# Patient Record
Sex: Female | Born: 1960
Health system: Southern US, Community
[De-identification: ages and names within clinical notes are randomized; demographics above are authoritative.]

## PROBLEM LIST (undated history)

## (undated) DIAGNOSIS — E785 Hyperlipidemia, unspecified: Secondary | ICD-10-CM

## (undated) DIAGNOSIS — B019 Varicella without complication: Secondary | ICD-10-CM

## (undated) DIAGNOSIS — I10 Essential (primary) hypertension: Secondary | ICD-10-CM

## (undated) HISTORY — DX: Varicella without complication: B01.9

## (undated) HISTORY — DX: Hyperlipidemia, unspecified: E78.5

---
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
## 2000-04-04 ENCOUNTER — Other Ambulatory Visit: Admission: RE | Admit: 2000-04-04 | Discharge: 2000-04-04 | Payer: Self-pay | Admitting: Obstetrics and Gynecology

## 2000-04-05 ENCOUNTER — Other Ambulatory Visit: Admission: RE | Admit: 2000-04-05 | Discharge: 2000-04-05 | Payer: Self-pay | Admitting: Obstetrics and Gynecology

## 2000-04-05 ENCOUNTER — Encounter (INDEPENDENT_AMBULATORY_CARE_PROVIDER_SITE_OTHER): Payer: Self-pay

## 2000-08-19 ENCOUNTER — Ambulatory Visit (HOSPITAL_COMMUNITY): Admission: RE | Admit: 2000-08-19 | Discharge: 2000-08-19 | Payer: Self-pay | Admitting: Obstetrics and Gynecology

## 2000-08-19 ENCOUNTER — Encounter: Payer: Self-pay | Admitting: Obstetrics and Gynecology

## 2001-07-02 HISTORY — PX: UTERINE FIBROID SURGERY: SHX826

## 2001-08-14 ENCOUNTER — Encounter: Admission: RE | Admit: 2001-08-14 | Discharge: 2001-08-14 | Payer: Self-pay

## 2001-08-21 ENCOUNTER — Encounter: Admission: RE | Admit: 2001-08-21 | Discharge: 2001-08-21 | Payer: Self-pay

## 2001-09-11 ENCOUNTER — Ambulatory Visit (HOSPITAL_COMMUNITY): Admission: RE | Admit: 2001-09-11 | Discharge: 2001-09-11 | Payer: Self-pay | Admitting: Obstetrics and Gynecology

## 2001-10-21 ENCOUNTER — Inpatient Hospital Stay (HOSPITAL_COMMUNITY): Admission: RE | Admit: 2001-10-21 | Discharge: 2001-10-23 | Payer: Self-pay | Admitting: Obstetrics and Gynecology

## 2001-10-21 ENCOUNTER — Encounter (INDEPENDENT_AMBULATORY_CARE_PROVIDER_SITE_OTHER): Payer: Self-pay

## 2003-02-11 ENCOUNTER — Emergency Department (HOSPITAL_COMMUNITY): Admission: EM | Admit: 2003-02-11 | Discharge: 2003-02-11 | Payer: Self-pay | Admitting: Emergency Medicine

## 2003-02-11 ENCOUNTER — Encounter: Payer: Self-pay | Admitting: Emergency Medicine

## 2006-07-02 HISTORY — PX: FOOT SURGERY: SHX648

## 2008-05-20 ENCOUNTER — Other Ambulatory Visit: Admission: RE | Admit: 2008-05-20 | Discharge: 2008-05-20 | Payer: Self-pay | Admitting: Obstetrics and Gynecology

## 2008-05-28 ENCOUNTER — Encounter: Admission: RE | Admit: 2008-05-28 | Discharge: 2008-05-28 | Payer: Self-pay | Admitting: Obstetrics & Gynecology

## 2009-06-07 ENCOUNTER — Other Ambulatory Visit: Admission: RE | Admit: 2009-06-07 | Discharge: 2009-06-07 | Payer: Self-pay | Admitting: Family Medicine

## 2009-07-15 ENCOUNTER — Encounter: Admission: RE | Admit: 2009-07-15 | Discharge: 2009-07-15 | Payer: Self-pay | Admitting: Family Medicine

## 2009-09-05 ENCOUNTER — Emergency Department (HOSPITAL_COMMUNITY): Admission: EM | Admit: 2009-09-05 | Discharge: 2009-09-05 | Payer: Self-pay | Admitting: Emergency Medicine

## 2010-07-23 ENCOUNTER — Encounter: Payer: Self-pay | Admitting: Family Medicine

## 2010-07-23 ENCOUNTER — Encounter: Payer: Self-pay | Admitting: Obstetrics & Gynecology

## 2010-08-02 ENCOUNTER — Other Ambulatory Visit: Payer: Self-pay | Admitting: Family Medicine

## 2010-08-02 DIAGNOSIS — Z1239 Encounter for other screening for malignant neoplasm of breast: Secondary | ICD-10-CM

## 2010-08-08 ENCOUNTER — Ambulatory Visit
Admission: RE | Admit: 2010-08-08 | Discharge: 2010-08-08 | Disposition: A | Discharge status: Home / Self Care | Payer: 59 | Source: Ambulatory Visit | Attending: Family Medicine | Admitting: Family Medicine

## 2010-08-08 DIAGNOSIS — Z1239 Encounter for other screening for malignant neoplasm of breast: Secondary | ICD-10-CM

## 2010-11-17 NOTE — Op Note (Signed)
[facility]  Patient:    Rachel Love, Rachel Love Visit Number: [id number] MRN: [id number]          Service Type: GYN Location: 4W 0444 01 Attending Physician:  Oliver Pila Dictated by:   Huel Cote, M.D. Proc. Date: 10/21/01 Admit Date:  10/21/2001                             Operative Report  PREOPERATIVE DIAGNOSES: 1. Fibroid uterus. 2. Menorrhagia. 3. Anemia.  POSTOPERATIVE DIAGNOSES: 1. Fibroid uterus. 2. Menorrhagia. 3. Anemia.  PROCEDURE:  Myomectomy and attempted chromopertubation.  SURGEON:  Huel Cote, M.D.  ASSISTANT:  Malachi Pro. Ambrose Mantle, M.D.  ANESTHESIA:  General.  FLUIDS:  Estimated blood loss 350 cc.  IV fluids 2400 cc LR.  Urine output 200 cc clear urine.  FINDINGS:  The uterus was enlarged with numerous fibroids, approximately 12-14 weeks size.  The fibroids ranged in size from 1 cm to 7-8 cm.  Tubes externally were clear of adhesions, and the fimbriae were normal; however, upon chromopertubation, they did not fill even proximally.  Ovaries are normal bilaterally.  Upon performing the myomectomy, the uterine cavity was entered superiorly.  DESCRIPTION OF PROCEDURE:  The patient was taken to the operating room where general anesthesia was obtained without difficulty.  She was then prepped and draped in the normal sterile fashion in the dorsal supine position.  A speculum was then placed within the vagina and the cervix identified and an acorn tenaculum introduced into the cervix for later injection of dye for a tubal study.  The speculum was removed and the acorn left in place with a single-tooth tenaculum.  Attention was then turned to the patients abdomen which was opened in a transverse fashion with a Pfannenstiel incision.  This was carried through the underlying layer of fascia with sharp dissection and Bovie cautery.  The fascia was then nicked in the midline, and the incision was extended laterally  with Mayo scissors.  The inferior aspect was grasped with Kocher clamps, elevated, and dissected off the underlying rectus muscles. In a similar fashion, the superior aspect was elevated and dissected off the rectus muscles.  The rectus muscles were separated in the midline and the peritoneum entered bluntly.  The peritoneal incision was then extended both superiorly and inferiorly with careful attention to avoid both bowel and bladder.  The bowel was packed away with moist laparotomy sponge and a pediatric Balfour retractor placed within the incision with the bladder blade in place.  The uterus was then inspected with multiple fibroids noticed, the largest being in the posterior right aspect of the uterus and in the anterior left aspect of the uterus.  There were multiple other fibroids noted, ranging in size from 1-7 cm.  Several incisions were made on the uterus, the largest being in the midline of the uterine fundus superiorly.  This was opened and the largest fibroid removed from the position in the posterior surface. Additional incisions were also made in the anterior fundus and posterior fundus, although none of these went the entire depth to the cavity. Approximately 16 fibroids were removed by opening the uterus in approximately five incisions and shelling out the fibroid from its cavity and removing it with towel clips.  All five incisions were then closed in the following manner:  There were several deep figure-of-eight sutures of 0 Vicryl placed and an additional running layer of 0 Vicryl and finally a running  locked layer of 3-0 Vicryl in a serosal stitch.  At the conclusion of the procedure, all incisions appeared hemostatic.  Attempt was then made to inject the uterine cavity and fallopian tubes with approximately 150 cc of methylene blue. Although there was some slight leakage noted at one of the uterine incisions anteriorly, no dye was noted to fill or spill from either  fallopian tube.  The uterus was once again inspected and found to be hemostatic.  All sponges and instruments were removed from the abdomen.  A piece of Intercede was placed along the posterior surface of the uterus to prevent adhesions, and the rectus muscles and subfascial plane were inspected and found to be hemostatic; therefore, the fascia was closed with 0 Vicryl in a running fashion.  The subcutaneous tissue was reapproximated with 2-0 plain in a running fashion, and the skin was closed with staples.  Sponge, lap, and needle counts were correct x 2, and the patient was taken to the recovery room in stable condition. Dictated by:   Huel Cote, M.D. Attending Physician:  Oliver Pila DD:  10/21/01 TD:  10/21/01 Job: [id number]

## 2010-11-17 NOTE — Discharge Summary (Signed)
[facility]  Patient:    Rachel Love, Rachel Love Visit Number: [id number] MRN: [id number]          Service Type: GYN Location: 4W 0444 01 Attending Physician:  Oliver Pila Dictated by:   Alvino Chapel, M.D. Admit Date:  10/21/2001 Discharge Date: 10/23/2001                             Discharge Summary  DISCHARGE DIAGNOSES: 1. Fibroid uterus. 2. Menorrhagia. 3. Symptomatic anemia. 4. Status post myomectomy. 5. Infertility.  DISCHARGE MEDICATIONS: 1. Motrin 600 mg p.o. q.6h. 2. Percocet one or two tablets p.o. q.4h. p.r.n.  FOLLOWUP:  The patient is to follow up in the office on October 27, 2001, for her staple removal.  HOSPITAL COURSE:  The patient is a 50 year old G1, P0, who is admitted for scheduled myomectomy, given an ongoing history with menorrhagia and symptomatic anemia.  The patient underwent this surgery on October 21, 2001, without complication, and had multiple myomas removed.  PAST MEDICAL HISTORY:  None.  PAST SURGICAL HISTORY:  Foot surgery in 1977.  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 No abnormal Pap smears.  PAST OBSTETRICAL HISTORY:  In 1978, the patient had a termination of pregnancy.  On discharge, the patient was afebrile with stable vital signs.  Postoperative hemoglobin was 9.9.  Incision was clear.  Abdomen was soft.  She was doing very well, and her pain was well controlled, and was passing normal flatus.  In summary, this is a 50 year old female who is admitted for elective myomectomy and final pathology revealed 212 g of various sized myomas removed, all benign in nature.  She had an uneventful postoperative course, and was felt stable for discharge on postoperative day #2.  CONDITION ON DISCHARGE:  Improved.Dictated by:   Alvino Chapel, M.D.  Attending Physician:  Oliver Pila DD:  10/23/01 TD:  10/23/01 Job: [id number]

## 2010-11-17 NOTE — H&P (Signed)
[facility]  Patient:    Rachel Love, CISSELL Visit Number: [id number] MRN: [id number]          Service Type: OUT Location: PADM Attending Physician:  Oliver Pila Dictated by:   Alvino Chapel, M.D. Admit Date:  09/11/2001 Discharge Date: 09/11/2001                           History and Physical  HISTORY OF PRESENT ILLNESS:  The patient is a 50 year old G1, P0, who is presenting for a scheduled myomectomy given an ongoing history with menorrhagia and symptomatic anemia.  The patient has a long history of fibroids and had simply dealt with her cycles until approximately six months ago when they became much heavier, lasting approximately 7-10 days and coming at least every 21-28 days.  The patients iron count had been as low as 8.8, and she had been placed on oral iron for improvement and on birth control pills to slow her menstrual flow prior to surgery.  The patient, given her problems with anemia, desires definitive surgical therapy, however, is not ready to proceed with hysterectomy and wants a myomectomy only.  PAST MEDICAL HISTORY:  None.  PAST SURGICAL HISTORY:   Foot surgery in 1977.  GYNECOLOGICAL HISTORY:  The patient does have some tubal factor infertility and has been worked up for this in the past extensively.  However, declined any further treatment secondary to cost considerations.  She has no history of abnormal Pap smears.  FAMILY HISTORY:  No history of breast cancer.  There is some heart disease in a grandmother and a grandfather.  OBSTETRICAL HISTORY:  In 1978, the patient had a termination at five months of pregnancy, vaginal induction of labor.  PHYSICAL EXAMINATION:  VITAL SIGNS:  Height 5 feet 7 inches, weight 189 pounds.  Blood pressure 120/70.  CARDIAC:  Regular rate and rhythm.  LUNGS:  Clear.  ABDOMEN:  Soft and nontender.  PELVIC:  Cervix is normal, with no lesions noted.  Uterus is enlarged  to approximately 12-14 weeks size with irregular contour consistent with fibroids.  There are no adnexal masses.  PLAN:  The patient was counseled of the risks and benefits of the surgery including bleeding, infection, and possible damage to bowel and bladder.  She was also counseled as to the possible risk of transfusion should she bleed significantly, given her history of anemia.  The patients blood count prior to surgery was 10.1 on her preoperative laboratories, which is substantially improved for her on iron therapy.  She will also continue her Ovcon oral contraceptives until the surgery is completed.  The patient was also counseled as to the possible risk of hysterectomy only should an emergency situation ensue with uncontrolled bleeding, and the patient understands this as well. She also realizes that a myomectomy is not a 100% guarantee for improved cycle control, however, wishes to proceed with this course of action rather than a hysterectomy. Dictated by:   Alvino Chapel, M.D. Attending Physician:  Oliver Pila DD:  10/16/01 TD:  10/17/01 Job: [id number]

## 2011-10-17 ENCOUNTER — Other Ambulatory Visit: Payer: Self-pay | Admitting: Family Medicine

## 2011-10-17 DIAGNOSIS — Z1231 Encounter for screening mammogram for malignant neoplasm of breast: Secondary | ICD-10-CM

## 2011-10-24 ENCOUNTER — Ambulatory Visit: Payer: 59

## 2013-05-22 ENCOUNTER — Other Ambulatory Visit: Payer: Self-pay

## 2013-06-03 ENCOUNTER — Other Ambulatory Visit: Payer: Self-pay | Admitting: Family Medicine

## 2013-06-03 DIAGNOSIS — N644 Mastodynia: Secondary | ICD-10-CM

## 2013-06-11 ENCOUNTER — Ambulatory Visit
Admission: RE | Admit: 2013-06-11 | Discharge: 2013-06-11 | Disposition: A | Discharge status: Home / Self Care | Payer: 59 | Source: Ambulatory Visit | Attending: Family Medicine | Admitting: Family Medicine

## 2013-06-11 DIAGNOSIS — N644 Mastodynia: Secondary | ICD-10-CM

## 2013-06-24 ENCOUNTER — Emergency Department (INDEPENDENT_AMBULATORY_CARE_PROVIDER_SITE_OTHER)
Admission: EM | Admit: 2013-06-24 | Discharge: 2013-06-24 | Disposition: A | Discharge status: Home / Self Care | Payer: 59 | Source: Home / Self Care | Attending: Family Medicine | Admitting: Family Medicine

## 2013-06-24 ENCOUNTER — Emergency Department (INDEPENDENT_AMBULATORY_CARE_PROVIDER_SITE_OTHER): Payer: 59

## 2013-06-24 ENCOUNTER — Encounter (HOSPITAL_COMMUNITY): Payer: Self-pay | Admitting: Emergency Medicine

## 2013-06-24 DIAGNOSIS — R05 Cough: Secondary | ICD-10-CM

## 2013-06-24 DIAGNOSIS — R059 Cough, unspecified: Secondary | ICD-10-CM

## 2013-06-24 DIAGNOSIS — R490 Dysphonia: Secondary | ICD-10-CM

## 2013-06-24 DIAGNOSIS — R053 Chronic cough: Secondary | ICD-10-CM

## 2013-06-24 MED ORDER — OMEPRAZOLE 40 MG PO CPDR: 40.0000 mg | DELAYED_RELEASE_CAPSULE | Freq: Every day | ORAL | Status: DC

## 2013-06-24 MED ORDER — GUAIFENESIN-CODEINE 100-10 MG/5ML PO SOLN: 5.0000 mL | Freq: Every evening | ORAL | Status: DC | PRN

## 2013-06-24 MED ORDER — IPRATROPIUM BROMIDE 0.06 % NA SOLN: 2.0000 | Freq: Four times a day (QID) | NASAL | Status: DC

## 2013-06-24 MED ORDER — PREDNISONE 10 MG PO TABS: 30.0000 mg | ORAL_TABLET | Freq: Every day | ORAL | Status: DC

## 2013-06-24 NOTE — ED Notes (Signed)
52 yr old is here today with complaints of hoarseness and sinus infection and is not getting any better.

## 2013-06-24 NOTE — ED Provider Notes (Signed)
Rachel Love is a 52 y.o. female who presents to Urgent Care today for 4 weeks of moderate bothersome non-productive cough and horse voice. She has been treated with both a zpack and augmentin. She has tried albuterol which has not helped much. She notes severe episodes of post tussive emesis but denies any other nausas vomiting or diarrhea. No fever chills or trouble breathing. She is well otherwise.    No past medical history on file. History  Substance Use Topics  . Smoking status: Never Smoker   . Smokeless tobacco: Not on file  . Alcohol Use: No   ROS as above Medications reviewed. No current facility-administered medications for this encounter.   Current Outpatient Prescriptions  Medication Sig Dispense Refill  . guaiFENesin-codeine 100-10 MG/5ML syrup Take 5 mLs by mouth at bedtime as needed for cough.  120 mL  0  . ipratropium (ATROVENT) 0.06 % nasal spray Place 2 sprays into both nostrils 4 (four) times daily.  15 mL  1  . omeprazole (PRILOSEC) 40 MG capsule Take 1 capsule (40 mg total) by mouth daily.  30 capsule  0  . predniSONE (DELTASONE) 10 MG tablet Take 3 tablets (30 mg total) by mouth daily.  15 tablet  0    Exam:  BP 151/82  Pulse 83  Temp(Src) 98.4 F (36.9 C) (Oral)  Resp 18  SpO2 99%  LMP 02/22/2013 Gen: Well NAD HEENT: EOMI,  MMM, TMs normal BL. Post pharynx is erythematous mildly.  Lungs: Normal work of breathing. CTABL Heart: RRR no MRG Abd: NABS, Soft. NT, ND Exts: Non edematous BL  LE, warm and well perfused.   No results found for this or any previous visit (from the past 24 hour(s)). Dg Chest 2 View  06/24/2013   CLINICAL DATA:  Cough and chest pain .  EXAM: CHEST  2 VIEW  COMPARISON:  None.  FINDINGS: Mediastinum and hilar structures are normal. Lungs are clear. Heart size normal.  IMPRESSION: No active cardiopulmonary disease.   Electronically Signed   By: Maisie Fus  Register   On: 06/24/2013 14:49    Assessment and Plan: 52 y.o. female with  chronic cough and hoarse voice. This is likely post viral versus postnasal drip. Plan to treat with several modalities. We'll use omeprazole to combat the possibility of reflux. Additionally using over-the-counter cetirizine for allergies and we'll continue albuterol for asthma. Additionally use low dose short duration prednisone and Atrovent nasal spray. Will additionally use codeine containing cough medication for symptom control. However if not improving would recommend followup with an ENT doctor.   Discussed warning signs or symptoms. Please see discharge instructions. Patient expresses understanding.      Rodolph Bong, MD 06/24/13 [phone]

## 2013-08-06 ENCOUNTER — Other Ambulatory Visit (HOSPITAL_COMMUNITY)
Admission: RE | Admit: 2013-08-06 | Discharge: 2013-08-06 | Disposition: A | Discharge status: Home / Self Care | Payer: 59 | Source: Ambulatory Visit | Attending: Family Medicine | Admitting: Family Medicine

## 2013-08-06 DIAGNOSIS — Z1151 Encounter for screening for human papillomavirus (HPV): Secondary | ICD-10-CM | POA: Insufficient documentation

## 2013-08-06 DIAGNOSIS — Z0000 Encounter for general adult medical examination without abnormal findings: Secondary | ICD-10-CM | POA: Insufficient documentation

## 2013-08-07 ENCOUNTER — Other Ambulatory Visit: Payer: Self-pay | Admitting: Family Medicine

## 2014-07-28 ENCOUNTER — Other Ambulatory Visit: Payer: Self-pay | Admitting: Family Medicine

## 2014-07-28 DIAGNOSIS — Z1231 Encounter for screening mammogram for malignant neoplasm of breast: Secondary | ICD-10-CM

## 2014-08-05 ENCOUNTER — Encounter (INDEPENDENT_AMBULATORY_CARE_PROVIDER_SITE_OTHER): Payer: Self-pay

## 2014-08-05 ENCOUNTER — Ambulatory Visit
Admission: RE | Admit: 2014-08-05 | Discharge: 2014-08-05 | Disposition: A | Discharge status: Home / Self Care | Payer: 59 | Source: Ambulatory Visit | Attending: Family Medicine | Admitting: Family Medicine

## 2014-08-05 DIAGNOSIS — Z1231 Encounter for screening mammogram for malignant neoplasm of breast: Secondary | ICD-10-CM

## 2016-04-10 ENCOUNTER — Other Ambulatory Visit: Payer: Self-pay | Admitting: Family Medicine

## 2016-04-10 DIAGNOSIS — Z1231 Encounter for screening mammogram for malignant neoplasm of breast: Secondary | ICD-10-CM

## 2016-04-20 ENCOUNTER — Ambulatory Visit
Admission: RE | Admit: 2016-04-20 | Discharge: 2016-04-20 | Disposition: A | Discharge status: Home / Self Care | Payer: 59 | Source: Ambulatory Visit | Attending: Family Medicine | Admitting: Family Medicine

## 2016-04-20 DIAGNOSIS — Z1231 Encounter for screening mammogram for malignant neoplasm of breast: Secondary | ICD-10-CM

## 2016-09-28 DIAGNOSIS — I10 Essential (primary) hypertension: Secondary | ICD-10-CM | POA: Diagnosis not present

## 2016-09-28 DIAGNOSIS — R51 Headache: Secondary | ICD-10-CM | POA: Diagnosis not present

## 2016-09-28 DIAGNOSIS — I160 Hypertensive urgency: Secondary | ICD-10-CM | POA: Diagnosis not present

## 2016-09-28 DIAGNOSIS — R42 Dizziness and giddiness: Secondary | ICD-10-CM | POA: Diagnosis not present

## 2016-09-28 DIAGNOSIS — Z87891 Personal history of nicotine dependence: Secondary | ICD-10-CM | POA: Diagnosis not present

## 2016-09-29 ENCOUNTER — Encounter (HOSPITAL_COMMUNITY): Payer: Self-pay | Admitting: Emergency Medicine

## 2016-09-29 ENCOUNTER — Emergency Department (HOSPITAL_COMMUNITY)
Admission: EM | Admit: 2016-09-29 | Discharge: 2016-09-29 | Disposition: A | Discharge status: Home / Self Care | Payer: 59 | Attending: Emergency Medicine | Admitting: Emergency Medicine

## 2016-09-29 DIAGNOSIS — I10 Essential (primary) hypertension: Secondary | ICD-10-CM | POA: Insufficient documentation

## 2016-09-29 DIAGNOSIS — Z79899 Other long term (current) drug therapy: Secondary | ICD-10-CM | POA: Insufficient documentation

## 2016-09-29 HISTORY — DX: Essential (primary) hypertension: I10

## 2016-09-29 MED ORDER — AMLODIPINE BESYLATE 5 MG PO TABS: 5.0000 mg | ORAL_TABLET | Freq: Every day | ORAL | 1 refills | Status: DC

## 2016-09-29 NOTE — ED Triage Notes (Signed)
Pt reports having headache that began this morning and has been having issues with elevated blood pressure. Pt states that she was at [facility] last night for HTN and given prescription for Norvac and took dose this morning. Pt reports bp at 205/112. Pt reports headache goes across front of head and aching in nature.

## 2016-09-29 NOTE — ED Provider Notes (Signed)
[facility]-EMERGENCY DEPT Provider Note   CSN: [id number] Arrival date & time: 09/29/16  1931     History   Chief Complaint Chief Complaint  Patient presents with  . Hypertension  . Headache    HPI Rachel Love is a 56 y.o. female.  HPI Patient was seen at [facility] yesterday with dizziness and hypertension. She had labs, CT and EKG done. She was started on Norvasc 5 mg. The patient reports that today she had a pressure headache on her forehead and was still concerned about her blood pressure. Today the blood pressures have been running 160s to 180s systolic. No blurred vision. No weakness numbness or incoordination. No chest pain or shortness of breath. No nausea. Yesterday patient went to the ED because she had developed dizziness while at work and checked her blood pressure which was at systolic 215 mmhg. The patient had been put on lisinopril 2 years ago for hypertension. After taking it for about a year she determined that it was the cause of persistent cough and discontinued it. She reports that she never followed up to start a different medication and did not monitor her blood pressure the interim time. Past Medical History:  Diagnosis Date  . Hypertension     There are no active problems to display for this patient.   Past Surgical History:  Procedure Laterality Date  . UTERINE FIBROID SURGERY  2003    OB History    No data available       Home Medications    Prior to Admission medications   Medication Sig Start Date End Date Taking? Authorizing Provider  amLODipine (NORVASC) 5 MG tablet Take 1 tablet (5 mg total) by mouth daily. 09/29/16   Arby Barrette, MD  guaiFENesin-codeine 100-10 MG/5ML syrup Take 5 mLs by mouth at bedtime as needed for cough. 06/24/13   Rodolph Bong, MD  ipratropium (ATROVENT) 0.06 % nasal spray Place 2 sprays into both nostrils 4 (four) times daily. 06/24/13   Rodolph Bong, MD  omeprazole (PRILOSEC) 40 MG capsule Take 1 capsule (40  mg total) by mouth daily. 06/24/13   Rodolph Bong, MD  predniSONE (DELTASONE) 10 MG tablet Take 3 tablets (30 mg total) by mouth daily. 06/24/13   Rodolph Bong, MD    Family History History reviewed. No pertinent family history.  Social History Social History  Substance Use Topics  . Smoking status: Never Smoker  . Smokeless tobacco: Never Used  . Alcohol use No     Allergies   Patient has no known allergies.   Review of Systems Review of Systems 10 Systems reviewed and are negative for acute change except as noted in the HPI.   Physical Exam Updated Vital Signs BP (!) 168/94   Pulse 86   Temp 98.3 F (36.8 C) (Oral)   Resp 18   Ht  (1.676 m)   Wt 226 lb (102.5 kg)   LMP 02/22/2013   SpO2 99%   BMI 36.48 kg/m   Physical Exam  Constitutional: She is oriented to person, place, and time. She appears well-developed and well-nourished. No distress.  HENT:  Head: Normocephalic and atraumatic.  Mouth/Throat: Oropharynx is clear and moist.  Eyes: Conjunctivae and EOM are normal. Pupils are equal, round, and reactive to light.  Neck: Neck supple.  Cardiovascular: Normal rate, regular rhythm and normal heart sounds.   No murmur heard. Pulmonary/Chest: Effort normal and breath sounds normal. No respiratory distress.  Abdominal: Soft. There  is no tenderness.  Musculoskeletal: She exhibits no edema or tenderness.  Neurological: She is alert and oriented to person, place, and time. No cranial nerve deficit. She exhibits normal muscle tone. Coordination normal.  Skin: Skin is warm and dry.  Psychiatric: She has a normal mood and affect.  Nursing note and vitals reviewed.    ED Treatments / Results  Labs (all labs ordered are listed, but only abnormal results are displayed) Labs Reviewed - No data to display  EKG  EKG Interpretation None       Radiology No results found.  Procedures Procedures (including critical care time)  Medications Ordered in  ED Medications - No data to display   Initial Impression / Assessment and Plan / ED Course  I have reviewed the triage vital signs and the nursing notes.  Pertinent labs & imaging results that were available during my care of the patient were reviewed by me and considered in my medical decision making (see chart for details).      Final Clinical Impressions(s) / ED Diagnoses   Final diagnoses:  Essential hypertension   Patient is alert and well. She does not have any neurologic dysfunction. She has had a pressure-like headache that is not severe today. No signs of endorgan damage. Patient had CT done yesterday when she was seen at [facility]. Blood pressure is actually improved after starting Norvasc. Pressures today ranged between systolic 180-160. At this time, I will increase the patient's Norvasc dose to 10 mg daily. She is counseled on dietary measures and expeditious follow-up for monitoring and recheck. New Prescriptions New Prescriptions   AMLODIPINE (NORVASC) 5 MG TABLET    Take 1 tablet (5 mg total) by mouth daily.     Arby Barrette, MD 09/29/16 2238

## 2016-10-23 ENCOUNTER — Encounter: Payer: Self-pay | Admitting: Family Medicine

## 2016-10-23 ENCOUNTER — Ambulatory Visit (INDEPENDENT_AMBULATORY_CARE_PROVIDER_SITE_OTHER): Payer: 59 | Admitting: Family Medicine

## 2016-10-23 VITALS — BP 145/90 | HR 97 | Resp 12 | Ht 66.0 in | Wt 230.4 lb

## 2016-10-23 DIAGNOSIS — M159 Polyosteoarthritis, unspecified: Secondary | ICD-10-CM | POA: Diagnosis not present

## 2016-10-23 DIAGNOSIS — Z1211 Encounter for screening for malignant neoplasm of colon: Secondary | ICD-10-CM | POA: Diagnosis not present

## 2016-10-23 DIAGNOSIS — E559 Vitamin D deficiency, unspecified: Secondary | ICD-10-CM | POA: Diagnosis not present

## 2016-10-23 DIAGNOSIS — E785 Hyperlipidemia, unspecified: Secondary | ICD-10-CM | POA: Diagnosis not present

## 2016-10-23 DIAGNOSIS — Z6837 Body mass index (BMI) 37.0-37.9, adult: Secondary | ICD-10-CM | POA: Diagnosis not present

## 2016-10-23 DIAGNOSIS — Z6834 Body mass index (BMI) 34.0-34.9, adult: Secondary | ICD-10-CM

## 2016-10-23 DIAGNOSIS — E669 Obesity, unspecified: Secondary | ICD-10-CM

## 2016-10-23 DIAGNOSIS — E663 Overweight: Secondary | ICD-10-CM | POA: Insufficient documentation

## 2016-10-23 DIAGNOSIS — I10 Essential (primary) hypertension: Secondary | ICD-10-CM | POA: Diagnosis not present

## 2016-10-23 LAB — LIPID PANEL
CHOL/HDL RATIO: 5
Cholesterol: 249 mg/dL — ABNORMAL HIGH (ref 0–200)
HDL: 46 mg/dL (ref >39.00)
LDL Cholesterol: 187 mg/dL — ABNORMAL HIGH (ref 0–99)
NonHDL: 203.15
TRIGLYCERIDES: 81 mg/dL (ref 0.0–149.0)
VLDL: 16.2 mg/dL (ref 0.0–40.0)

## 2016-10-23 LAB — VITAMIN D 25 HYDROXY (VIT D DEFICIENCY, FRACTURES): VITD: 35.68 ng/mL (ref 30.00–100.00)

## 2016-10-23 MED ORDER — AMLODIPINE BESYLATE 10 MG PO TABS: 10.0000 mg | ORAL_TABLET | Freq: Every day | ORAL | 3 refills | Status: DC

## 2016-10-23 NOTE — Progress Notes (Signed)
Pre visit review using our clinic review tool, if applicable. No additional management support is needed unless otherwise documented below in the visit note. 

## 2016-10-23 NOTE — Patient Instructions (Signed)
A few things to remember from today's visit:   Colon cancer screening - Plan: Ambulatory referral to Gastroenterology  Hypertension, essential, benign - Plan: amLODipine (NORVASC) 10 MG tablet  Vitamin D deficiency - Plan: VITAMIN D 25 Hydroxy (Vit-D Deficiency, Fractures)  Hyperlipidemia, unspecified hyperlipidemia type - Plan: Lipid panel  Blood pressure goal for most people is less than 140/90. Some populations (older than 60) the goal is less than 150/90.  Most recent cardiologists' recommendations recommend blood pressure at or less than 130/80.   Elevated blood pressure increases the risk of strokes, heart and kidney disease, and eye problems. Regular physical activity and a healthy diet (DASH diet) usually help. Low salt diet. Take medications as instructed.  Caution with some over the counter medications as cold medications, dietary products (for weight loss), and Ibuprofen or Aleve (frequent use);all these medications could cause elevation of blood pressure.  DASH diet recommended: high in vegetables, fruits, low-fat dairy products, whole grains, poultry, fish, and nuts; and low in sweets, sugar-sweetened beverages, and red meats.  Tumeric for joint pain.    Please be sure medication list is accurate. If a new problem present, please set up appointment sooner than planned today.

## 2016-10-23 NOTE — Progress Notes (Signed)
HPI:   [name] is a 56 y.o. female, who is here today to establish care.  Former PCP: Dr Tamala Julian Last preventive routine visit: 06/2015  Chronic medical problems: HTN, vit D deficiency,and HLD among some.    Concerns today:   HTN Since 2014. Recent ER visit, 09/28/16 and 09/29/16 because elevated BP and headache. She was started on Amlodipine 5 mg daily. 09/28/16 Head CT was done: Negative. Also CBC,CMP,urine tox screening, and TSH were done,otherwise negative except for GLU 104 and Hg 11.8.   Home BP's: 120/70 first thing in the morning right after getting up, latter during the day:130/70's Dx with HTN 06/2015. She took Lisinopril in the past that caused cough, so discontinued.  Denies severe headache (resolved), visual changes, chest pain, dyspnea, palpitation, claudication, focal weakness, or edema.  She exercises regularly, since her last ER visit she has changed her diet: decreased salt intake and increased vegetables. Last eye exam 06/2016.  Joint pain: For years she has had generalized arthralgias, hips mainly, anterior and lateral aspect bilateral.  Also IP both hands and knees. Pain exacerbated by certain activities, prolonged walking and standing (knees and hips). No limitation of IP joints. + Stiffness,intermittently, alleviated by movement.  No edema or erythema. Joint pain getting worse. She attributes problem to wt gain, hoping pain will resolve if she loses wt.  She would like Vit D checked,reports remote Hx of low Vit D. 2015 she completed Ergocalciferol 50,000 U treatment. She is not on Vit D supplementation.  HLD: Last FLP 08/2013: TC 235,LDL 181,HDL 44,and TG 52 She is on non pharmacologic treatment.   Review of Systems  Constitutional: Negative for activity change, appetite change, fatigue and fever.  HENT: Negative for mouth sores, nosebleeds, sore throat and trouble swallowing.   Eyes: Negative for redness and visual  disturbance.  Respiratory: Negative for cough, shortness of breath and wheezing.   Cardiovascular: Negative for chest pain, palpitations and leg swelling.  Gastrointestinal: Negative for abdominal pain, nausea and vomiting.       Negative for changes in bowel habits.  Endocrine: Negative for cold intolerance and heat intolerance.  Genitourinary: Negative for decreased urine volume and hematuria.  Musculoskeletal: Positive for arthralgias. Negative for gait problem, joint swelling and myalgias.  Skin: Negative for pallor and rash.  Neurological: Negative for syncope, weakness and headaches.  Psychiatric/Behavioral: Negative for confusion. The patient is not nervous/anxious.     No current outpatient prescriptions on file prior to visit.   No current facility-administered medications on file prior to visit.      Past Medical History:  Diagnosis Date  . Chicken pox   . Hyperlipidemia   . Hypertension    No Known Allergies  Family History  Problem Relation Age of Onset  . Hypertension Mother   . Cancer Father     lung  . Heart disease Maternal Uncle     CAD    Social History   Social History  . Marital status: Married    Spouse name: N/A  . Number of children: N/A  . Years of education: N/A   Social History Main Topics  . Smoking status: Never Smoker  . Smokeless tobacco: Never Used  . Alcohol use No  . Drug use: No  . Sexual activity: Yes   Other Topics Concern  . None   Social History Narrative  . None    Vitals:   10/23/16 1406 10/23/16 1447  BP: (!) 150/98 (!) 145/90  Pulse: 97   Resp: 12   O2 sat at RA 97% Body mass index is 37.18 kg/m.   Physical Exam  Nursing note and vitals reviewed. Constitutional: She is oriented to person, place, and time. She appears well-developed. No distress.  HENT:  Head: Atraumatic.  Mouth/Throat: Oropharynx is clear and moist and mucous membranes are normal.  Eyes: Conjunctivae and EOM are normal. Pupils are  equal, round, and reactive to light.  Neck: No tracheal deviation present. No thyroid mass and no thyromegaly present.  Cardiovascular: Normal rate and regular rhythm.   No murmur heard. Pulses:      Dorsalis pedis pulses are 2+ on the right side, and 2+ on the left side.  Respiratory: Effort normal and breath sounds normal. No respiratory distress.  GI: Soft. She exhibits no mass. There is no hepatomegaly. There is no tenderness.  Musculoskeletal: She exhibits no edema.  Decreased ROM hips, mild pain elicited. No signs of synovitis. Antalgic gait.  Lymphadenopathy:    She has no cervical adenopathy.  Neurological: She is alert and oriented to person, place, and time. She has normal strength. Coordination normal.  Skin: Skin is warm. No erythema.  Psychiatric: She has a normal mood and affect.  Well groomed, good eye contact.    ASSESSMENT AND PLAN:   Rachel Love was seen today for establish care.  Diagnoses and all orders for this visit:  Lab Results  Component Value Date   CHOL 249 (H) 10/23/2016   HDL 46.00 10/23/2016   LDLCALC 187 (H) 10/23/2016   TRIG 81.0 10/23/2016   CHOLHDL 5 10/23/2016    Generalized osteoarthritis of multiple sites  We discussed Dx, treatment options, and prognosis. OTC Tumeric may help as well as topical Icy hot,Tai [place]. Tylenol 650 mg tid recommended, can take Tylenol PM at night.  Hypertension, essential, benign  Still not well controlled. Possible complications of elevated BP discussed. Amlodipine increased from 5 mg to 10 mg. Annual eye examination. BP check in 6 weeks and f/u in 3-4 months.  -     amLODipine (NORVASC) 10 MG tablet; Take 1 tablet (10 mg total) by mouth daily.  Vitamin D deficiency  Vit D3 [phone] U daily. Further recommendations will be given according to lab results.  -     VITAMIN D 25 Hydroxy (Vit-D Deficiency, Fractures)  Hyperlipidemia, unspecified hyperlipidemia type  Continue low fat diet, will follow labs  done today and will give further recommendations accordingly. F/U in 6-12 months.  -     Lipid panel  Class 2 obesity without serious comorbidity with body mass index (BMI) of 37.0 to 37.9 in adult, unspecified obesity type  We discussed benefits of wt loss as well as adverse effects of obesity. Consistency with healthy diet and physical activity recommended.   Colon cancer screening -     Ambulatory referral to Gastroenterology       Raychelle Hudman G. [place], MD  [facility]. [place] office.

## 2016-10-29 ENCOUNTER — Other Ambulatory Visit: Payer: Self-pay

## 2016-10-29 MED ORDER — PRAVASTATIN SODIUM 20 MG PO TABS: 20.0000 mg | ORAL_TABLET | Freq: Every day | ORAL | 3 refills | Status: DC

## 2016-12-24 NOTE — Progress Notes (Signed)
HPI:   Rachel Love is a 56 y.o. female, who is here today to follow on recent OV.   She was last seen on 10/23/16.   Hypertension:   Home BP readings: 120's/70-80's.  Currently on Amlodipine 10 mg daily.    She is taking medications as instructed, no side effects reported.  She has not noted headache, visual changes, exertional chest pain, dyspnea,  focal weakness, or worsening edema.  Labs done on 09/28/16:   Na 144   Potassium 3.8   Cl 104   CO2 29   Glucose 104 (H)   BUN 15   Creatinine 0.63   Ca 9.4   ALK PHOS 121   T Bili 0.28   Total Protein 7.6   Alb 3.9   GLOBULIN 3.7   ALBUMIN/GLOBULIN RATIO 1.1   BUN/CREAT RATIO 23.8   ALT 14   AST 17   GFR AFRICAN AMERICAN 117    She brought her BP monitor today.  Obesity:  She has changed her diet, has noted some wt loss. Yesterday she had:  Breakfast: Apple, coffee  with cream. Lunch: Salad and apple. Dinner: [place] and squash   She has not started exercising but planning on starting going to the gym.   Hyperlipidemia:  Currently on non pharmacologic treat,et, she discontinued Pravastatin 20 mg because it caused muscle aching.Symptom resolved a few days after she discontinued statin.    Lab Results  Component Value Date   CHOL 249 (H) 10/23/2016   HDL 46.00 10/23/2016   LDLCALC 187 (H) 10/23/2016   TRIG 81.0 10/23/2016   CHOLHDL 5 10/23/2016     Review of Systems  Constitutional: Negative for appetite change, fatigue and fever.  HENT: Negative for mouth sores, nosebleeds and trouble swallowing.   Eyes: Negative for redness and visual disturbance.  Respiratory: Negative for shortness of breath and wheezing.   Cardiovascular: Negative for chest pain, palpitations and leg swelling.  Gastrointestinal: Negative for abdominal pain, nausea and vomiting.       Negative for changes in bowel habits.  Genitourinary: Negative for decreased urine volume and hematuria.  Skin:  Negative for rash.  Neurological: Negative for syncope, weakness and headaches.  Psychiatric/Behavioral: Negative for confusion. The patient is not nervous/anxious.     No current outpatient prescriptions on file prior to visit.   No current facility-administered medications on file prior to visit.      Past Medical History:  Diagnosis Date  . Chicken pox   . Hyperlipidemia   . Hypertension    No Known Allergies  Social History   Social History  . Marital status: Married    Spouse name: N/A  . Number of children: N/A  . Years of education: N/A   Social History Main Topics  . Smoking status: Never Smoker  . Smokeless tobacco: Never Used  . Alcohol use No  . Drug use: No  . Sexual activity: Yes   Other Topics Concern  . None   Social History Narrative  . None    Vitals:   12/25/16 1420 12/25/16 1447  BP: (!) 142/90 140/82  Pulse: 85   Resp: 12    Body mass index is 36.24 kg/m.  Wt Readings from Last 3 Encounters:  12/25/16 224 lb 8 oz (101.8 kg)  10/23/16 230 lb 6 oz (104.5 kg)  09/29/16 226 lb (102.5 kg)    Physical Exam  Nursing note and vitals reviewed. Constitutional: She is oriented to  person, place, and time. She appears well-developed. No distress.  HENT:  Head: Atraumatic.  Mouth/Throat: Oropharynx is clear and moist and mucous membranes are normal.  Eyes: Conjunctivae and EOM are normal. Pupils are equal, round, and reactive to light.  Cardiovascular: Normal rate and regular rhythm.   No murmur heard. Pulses:      Dorsalis pedis pulses are 2+ on the right side, and 2+ on the left side.  Respiratory: Effort normal and breath sounds normal. No respiratory distress.  Musculoskeletal: She exhibits edema (1+ bilateral pitting LE edema.). She exhibits no tenderness.  Lymphadenopathy:    She has no cervical adenopathy.  Neurological: She is alert and oriented to person, place, and time. She has normal strength. Gait normal.  Antalgic gait.    Skin: Skin is warm. No erythema.  Psychiatric: She has a normal mood and affect.  Well groomed, good eye contact.     ASSESSMENT AND PLAN:    Ms Lachlan was seen today for follow-up.  Diagnoses and all orders for this visit:  Class 2 obesity without serious comorbidity with body mass index (BMI) of 37.0 to 37.9 in adult, unspecified obesity type  She lost about 6 Lb since 09/2016. We discussed benefits of wt loss as well as adverse effects of obesity. Consistency with healthy diet and engaging in physical activity recommended. We discussed some of her choices and better options she can try. For breakfast recommend Eggs 1-2 and a piece of toast for ex. < 100 cal snacks, and checking labels.  Daily brisk walking for 15-30 min as tolerated.  Hypertension, essential, benign  Her BP monitor seems to be accurate. Adequately controlled based on home BP readings. Some side effects of Amlodipine discussed.  No changes in current management. DASH-low salt diet to continue. Eye exam recommended annually. F/U in 6 months, before if needed.  -     amLODipine (NORVASC) 10 MG tablet; Take 1 tablet (10 mg total) by mouth daily.  Hyperlipidemia, unspecified hyperlipidemia type  She did not tolerate statin. Continue non pharmacologic treatment. Will re-check FLP next OB, 5 months.       Lewellyn Fultz G. [place], MD  [facility]. [place] office.

## 2016-12-25 ENCOUNTER — Encounter: Payer: Self-pay | Admitting: Family Medicine

## 2016-12-25 ENCOUNTER — Ambulatory Visit (INDEPENDENT_AMBULATORY_CARE_PROVIDER_SITE_OTHER): Payer: 59 | Admitting: Family Medicine

## 2016-12-25 VITALS — BP 140/82 | HR 85 | Resp 12 | Ht 66.0 in | Wt 224.5 lb

## 2016-12-25 DIAGNOSIS — Z6837 Body mass index (BMI) 37.0-37.9, adult: Secondary | ICD-10-CM

## 2016-12-25 DIAGNOSIS — E669 Obesity, unspecified: Secondary | ICD-10-CM | POA: Diagnosis not present

## 2016-12-25 DIAGNOSIS — E785 Hyperlipidemia, unspecified: Secondary | ICD-10-CM

## 2016-12-25 DIAGNOSIS — I10 Essential (primary) hypertension: Secondary | ICD-10-CM

## 2016-12-25 MED ORDER — AMLODIPINE BESYLATE 10 MG PO TABS: 10.0000 mg | ORAL_TABLET | Freq: Every day | ORAL | 2 refills | Status: DC

## 2016-12-25 NOTE — Patient Instructions (Signed)
A few things to remember from today's visit:   Class 2 obesity without serious comorbidity with body mass index (BMI) of 37.0 to 37.9 in adult, unspecified obesity type  Hypertension, essential, benign - Plan: amLODipine (NORVASC) 10 MG tablet  Hyperlipidemia, unspecified hyperlipidemia type   Please be sure medication list is accurate. If a new problem present, please set up appointment sooner than planned today.

## 2017-03-22 ENCOUNTER — Ambulatory Visit (INDEPENDENT_AMBULATORY_CARE_PROVIDER_SITE_OTHER): Payer: 59 | Admitting: Family Medicine

## 2017-03-22 ENCOUNTER — Telehealth: Payer: Self-pay

## 2017-03-22 ENCOUNTER — Ambulatory Visit (INDEPENDENT_AMBULATORY_CARE_PROVIDER_SITE_OTHER)
Admission: RE | Admit: 2017-03-22 | Discharge: 2017-03-22 | Disposition: A | Discharge status: Home / Self Care | Payer: 59 | Source: Ambulatory Visit | Attending: Family Medicine | Admitting: Family Medicine

## 2017-03-22 ENCOUNTER — Encounter: Payer: Self-pay | Admitting: Family Medicine

## 2017-03-22 VITALS — BP 158/80 | HR 113 | Temp 98.4°F | Wt 224.8 lb

## 2017-03-22 DIAGNOSIS — I10 Essential (primary) hypertension: Secondary | ICD-10-CM | POA: Diagnosis not present

## 2017-03-22 DIAGNOSIS — R200 Anesthesia of skin: Secondary | ICD-10-CM

## 2017-03-22 LAB — BASIC METABOLIC PANEL
BUN: 14 mg/dL (ref 6–23)
CO2: 26 mEq/L (ref 19–32)
Calcium: 9.6 mg/dL (ref 8.4–10.5)
Chloride: 105 mEq/L (ref 96–112)
Creatinine, Ser: 0.69 mg/dL (ref 0.40–1.20)
GFR: 113.24 mL/min (ref >60.00)
GLUCOSE: 151 mg/dL — AB (ref 70–99)
POTASSIUM: 3.7 meq/L (ref 3.5–5.1)
Sodium: 138 mEq/L (ref 135–145)

## 2017-03-22 LAB — FOLATE: Folate: 10.6 ng/mL (ref >5.9)

## 2017-03-22 LAB — VITAMIN B12: Vitamin B-12: 780 pg/mL (ref 211–911)

## 2017-03-22 NOTE — Progress Notes (Signed)
Subjective:    Patient ID: Rachel Love, female    DOB: 06/23/1961, 56 y.o.   MRN: [id number]  Chief Complaint  Patient presents with  . Numbness    HPI Patient was seen today for acute concern.  Patient with right-sided numbness since Monday. On Sunday patient took Norvasc but forgot and took another pill. When patient lies sheet the second pill she induced vomiting. Patient states after that she felt funny. The next day patient noticed the right-sided numbness. Later that Monday afternoon patient felt anxious secondary to increased stress. Patient caught the feeling would go away but it has not. Today at work patient felt some lightheadedness. BP was checked and normal. Patient decided to come into clinic to be checked. Numbness is in the right side of patient's jaw, neck, right arm, side of right leg.  Patient denies right-sided weakness, tingling, headache, changes in vision, nausea, vomiting, facial droop, difficulty with speech, recent travel. Patient does mention possible mosquito bite approximately one week ago.  Of note patient with history of low back pain. Currently ambulating with a limp secondary to back pain.  Past Medical History:  Diagnosis Date  . Chicken pox   . Hyperlipidemia   . Hypertension     Past Surgical History:  Procedure Laterality Date  . UTERINE FIBROID SURGERY  2003    Family History  Problem Relation Age of Onset  . Hypertension Mother   . Cancer Father        lung  . Heart disease Maternal Uncle        CAD    Social History   Social History  . Marital status: Married    Spouse name: N/A  . Number of children: N/A  . Years of education: N/A   Occupational History  . Not on file.   Social History Main Topics  . Smoking status: Never Smoker  . Smokeless tobacco: Never Used  . Alcohol use No  . Drug use: No  . Sexual activity: Yes   Other Topics Concern  . Not on file   Social History Narrative  . No narrative on file     Outpatient Medications Prior to Visit  Medication Sig Dispense Refill  . amLODipine (NORVASC) 10 MG tablet Take 1 tablet (10 mg total) by mouth daily. 90 tablet 2   No facility-administered medications prior to visit.     No Known Allergies  ROS General: Denies fever, chills, night sweats, changes in weight, changes in appetite HEENT: Denies headaches, ear pain, changes in vision, rhinorrhea, sore throat CV: Denies CP, palpitations, SOB, orthopnea Pulm: Denies SOB, cough, wheezing GI: Denies abdominal pain, nausea, vomiting, diarrhea, constipation GU: Denies dysuria, hematuria, frequency, vaginal discharge Msk: Denies muscle cramps, joint pains Neuro: Denies weakness, numbness, tingling  + right-sided numbness Skin: Denies rashes, bruising Psych: Denies depression, anxiety, hallucinations     Objective:    Blood pressure (!) 158/80, pulse (!) 113, temperature 98.4 F (36.9 C), temperature source Oral, weight 224 lb 12.8 oz (102 kg), last menstrual period 02/22/2013.   Gen. Pleasant, well-nourished, in no distress, normal affect   HEENT: [place]/AT, face symmetric, no scleral icterus, PERRLA, EOMI, nares patent without drainage, pharynx without erythema or exudate. Neck: No JVD, no thyromegaly, no carotid bruits Lungs: no accessory muscle use, CTAB, no wheezes or rales Cardiovascular: RRR, no m/r/g, no peripheral edema Abdomen: soft and non-tender, no hepatosplenomegaly, BS normal. Musculoskeletal: No deformities, no cyanosis or clubbing, normal tone.   Neuro:  A&Ox3,  CN II-XII intact, normal gait, Strength in UEs and LEs b/l 5/5.  Sensation in face and extremities equal b/l. Skin:  Warm, no lesions/ rash  Assessment/Plan:  Right Sided Numbness  -Discussed possible causes with pt: Vitamin deficiency, electrolyte abnormality, viral infection, TIA, CVA. -The less likely given normal neuro exam will obtain CT head stat to rule out TIA/CVA. - Plan: CT HEAD WO CONTRAST, Basic  metabolic panel, Vitamin B12, Folate -Patient to follow-up with PCP next week.  HTN -Elevated in clinic -Possibly secondary to increased stress given above -We will check BMP -Continue Norvasc 10 mg daily -Will reevaluate in 1 week.

## 2017-03-22 NOTE — Telephone Encounter (Signed)
I spoke with patient. Patient is c/o of new onset right side numbness, BP is fine, patient usually has left side numbness and has nerve pain. Patient denies headaches, weakness. She did feel a little light headed on Monday at work. After speaking with Dr. [place], she would like patient to be seen since the right side numbness is new. Appointment made for 1:30 pm with Dr. Salomon Fick.

## 2017-03-25 NOTE — Progress Notes (Signed)
HPI:   Rachel Love is a 56 y.o. female, who is here today to follow on recent acute OV.   She was seen on 03/22/17 by Rachel Love because 5 days of right-sided numbness: Jaw,RUE,and RLE.  No associated weakness or severe headache. Headache was more like pressure headache, thought to be sinuses.  + Nasal congestion and post nasal drainage as well as fullness ear sensation. She denies fever, chills, sore throat, cough, or dyspnea. She is planning on taking OTC sinus medication that helped in the past, Norel. No sick contact or recent travel.  Symptoms seem aggravated by season changes. No alleviating factors identified.   Head CT ordered 03/22/17 was in normal limits.  She states that symptoms resolved after she learned that head CT was normal. She attributes symptoms to stress.  HTN, she is on Amlodipine 10 mg daily BP was elevated during visit at 158/80. She's not checking BP at home. She is following low salt diet. Denies visual changes, chest pain, dyspnea, palpitation, or edema.   Lab Results  Component Value Date   CREATININE 0.69 03/22/2017   BUN 14 03/22/2017   NA 138 03/22/2017   K 3.7 03/22/2017   CL 105 03/22/2017   CO2 26 03/22/2017   Lab Results  Component Value Date   VITAMINB12 780 03/22/2017   Folate normal at 10.6.  Left big toe still numb but this problem has been going on for a year.   Hyperlipidemia:  Currently on Non-pharmacologic treatment.  Cholesterol medication was recommended in April this year but she decided not to take it, she was afraid of having side effects.   Lab Results  Component Value Date   CHOL 249 (H) 10/23/2016   HDL 46.00 10/23/2016   LDLCALC 187 (H) 10/23/2016   TRIG 81.0 10/23/2016   CHOLHDL 5 10/23/2016    She states that she doesn't want to take medications: "My goal is to be on no medications."    She is not exercising regularly. She just started watching her diet, 3 days ago she stopped  coffee (with cream and sugar).  She is also requesting a referral to nutritionist.   Review of Systems  Constitutional: Negative for activity change, appetite change, fatigue and fever.  HENT: Positive for congestion, postnasal drip, rhinorrhea and sinus pressure. Negative for dental problem, ear pain, facial swelling, mouth sores, nosebleeds, sore throat and trouble swallowing.   Eyes: Negative for redness and visual disturbance.  Respiratory: Negative for cough, shortness of breath and wheezing.   Cardiovascular: Negative for chest pain, palpitations and leg swelling.  Gastrointestinal: Negative for abdominal pain, nausea and vomiting.       Negative for changes in bowel habits.  Endocrine: Negative for cold intolerance, heat intolerance, polydipsia, polyphagia and polyuria.  Genitourinary: Negative for decreased urine volume, dysuria and hematuria.  Musculoskeletal: Negative for gait problem and myalgias.  Skin: Negative for pallor and rash.  Neurological: Negative for dizziness, seizures, syncope, weakness and numbness.  Hematological: Negative for adenopathy. Does not bruise/bleed easily.  Psychiatric/Behavioral: Negative for confusion. The patient is nervous/anxious.       Current Outpatient Prescriptions on File Prior to Visit  Medication Sig Dispense Refill  . amLODipine (NORVASC) 10 MG tablet Take 1 tablet (10 mg total) by mouth daily. 90 tablet 2   No current facility-administered medications on file prior to visit.      Past Medical History:  Diagnosis Date  . Chicken pox   . Hyperlipidemia   .  Hypertension    No Known Allergies  Social History   Social History  . Marital status: Married    Spouse name: N/A  . Number of children: N/A  . Years of education: N/A   Social History Main Topics  . Smoking status: Never Smoker  . Smokeless tobacco: Never Used  . Alcohol use No  . Drug use: No  . Sexual activity: Yes   Other Topics Concern  . None   Social  History Narrative  . None    Vitals:   03/26/17 0910  BP: 130/80  Pulse: 91  Resp: 12  SpO2: 93%   Body mass index is 35.75 kg/m.   Physical Exam  Nursing note and vitals reviewed. Constitutional: She is oriented to person, place, and time. She appears well-developed. No distress.  HENT:  Head: Normocephalic and atraumatic.  Right Ear: Hearing, tympanic membrane, external ear and ear canal normal.  Left Ear: Hearing, tympanic membrane, external ear and ear canal normal.  Nose: Right sinus exhibits no maxillary sinus tenderness and no frontal sinus tenderness. Left sinus exhibits no maxillary sinus tenderness and no frontal sinus tenderness.  Mouth/Throat: Oropharynx is clear and moist and mucous membranes are normal.  Hypertrophic turbinates. Right tonsil slightly hypertrophic, left tonsil stone present. Postnasal drainage.  Eyes: Pupils are equal, round, and reactive to light. Conjunctivae and EOM are normal.  Cardiovascular: Normal rate and regular rhythm.   No murmur heard. Pulses:      Dorsalis pedis pulses are 2+ on the right side, and 2+ on the left side.  Respiratory: Effort normal and breath sounds normal. No respiratory distress.  GI: Soft. She exhibits no mass. There is no hepatomegaly. There is no tenderness.  Musculoskeletal: She exhibits no edema.       Cervical back: She exhibits spasm. She exhibits normal range of motion, no tenderness and no bony tenderness.  Lymphadenopathy:    She has no cervical adenopathy.  Neurological: She is alert and oriented to person, place, and time. She has normal strength. No cranial nerve deficit or sensory deficit. Coordination and gait normal.  Skin: Skin is warm. No rash noted. No erythema.  Psychiatric: Her mood appears anxious.  Well groomed, good eye contact.    ASSESSMENT AND PLAN:   Rachel Love was seen today for follow-up.  Diagnoses and all orders for this visit:  Numbness on right side  Resolved. We  discussed possible etiologies. Head CT negative. Instructed about warning signs. Further recommendations will be given according to lab results.  -     TSH -     Hemoglobin A1c  Hypertension, essential, benign  Adequately controlled. Educated about Dx and risk factors as well as prognosis. No changes in current management. DASH-low salt diet recommended. Eye exam recommended annually. F/U in 6 months, before if needed.  -     Lipid panel -     TSH -     Hemoglobin A1c  Class 2 obesity without serious comorbidity with body mass index (BMI) of 37.0 to 37.9 in adult, unspecified obesity type  We discussed benefits of wt loss as well as adverse effects of obesity. Consistency with healthy diet and physical activity recommended. Weight Watchers is a good option as well as daily brisk walking for 15-30 min as tolerated. Nutritionist counseling will be arranged.  -     TSH -     Amb ref to Medical Nutrition Therapy-MNT  Hyperlipidemia, unspecified hyperlipidemia type  Further recommendations will be given  according to labs/imaging results. We discussed CVD risk factors and benefits + side effects of statins.  Continue working on low fat diet for now.  -     TSH -     Lipid panel  Allergic rhinitis, unspecified seasonality, unspecified trigger  Reassured. Treatment options and symptoms discussed. Flonase nasal spray and OTC antihistaminic as well as nasal irrigations with saline recommended. Avoid OTC decongestants or cold meds, side effects discussed. F/U as needed.  -     fluticasone (FLONASE) 50 MCG/ACT nasal spray; Place 1 spray into both nostrils 2 (two) times daily.      Rachel Szeliga G. [place], MD  [facility]. [place] office.

## 2017-03-26 ENCOUNTER — Ambulatory Visit (INDEPENDENT_AMBULATORY_CARE_PROVIDER_SITE_OTHER): Payer: 59 | Admitting: Family Medicine

## 2017-03-26 ENCOUNTER — Encounter: Payer: Self-pay | Admitting: Family Medicine

## 2017-03-26 VITALS — BP 130/80 | HR 91 | Resp 12 | Ht 66.0 in | Wt 221.5 lb

## 2017-03-26 DIAGNOSIS — Z6837 Body mass index (BMI) 37.0-37.9, adult: Secondary | ICD-10-CM | POA: Diagnosis not present

## 2017-03-26 DIAGNOSIS — J309 Allergic rhinitis, unspecified: Secondary | ICD-10-CM

## 2017-03-26 DIAGNOSIS — E785 Hyperlipidemia, unspecified: Secondary | ICD-10-CM

## 2017-03-26 DIAGNOSIS — R200 Anesthesia of skin: Secondary | ICD-10-CM | POA: Diagnosis not present

## 2017-03-26 DIAGNOSIS — E669 Obesity, unspecified: Secondary | ICD-10-CM | POA: Diagnosis not present

## 2017-03-26 DIAGNOSIS — I10 Essential (primary) hypertension: Secondary | ICD-10-CM | POA: Diagnosis not present

## 2017-03-26 LAB — LIPID PANEL
Cholesterol: 252 mg/dL — ABNORMAL HIGH (ref 0–200)
HDL: 48.1 mg/dL (ref >39.00)
LDL Cholesterol: 193 mg/dL — ABNORMAL HIGH (ref 0–99)
NONHDL: 203.79
Total CHOL/HDL Ratio: 5
Triglycerides: 52 mg/dL (ref 0.0–149.0)
VLDL: 10.4 mg/dL (ref 0.0–40.0)

## 2017-03-26 LAB — TSH: TSH: 1.25 u[IU]/mL (ref 0.35–4.50)

## 2017-03-26 LAB — HEMOGLOBIN A1C: HEMOGLOBIN A1C: 6 % (ref 4.6–6.5)

## 2017-03-26 MED ORDER — FLUTICASONE PROPIONATE 50 MCG/ACT NA SUSP: 1.0000 | Freq: Two times a day (BID) | NASAL | 3 refills | Status: DC

## 2017-03-26 NOTE — Patient Instructions (Signed)
A few things to remember from today's visit:   Hypertension, essential, benign - Plan: Lipid panel, TSH, Hemoglobin A1c  Numbness on right side  Class 2 obesity without serious comorbidity with body mass index (BMI) of 37.0 to 37.9 in adult, unspecified obesity type - Plan: Amb ref to Medical Nutrition Therapy-MNT  Zyrtec 10 mg daily OR Allegra 180 mg daily.    Please be sure medication list is accurate. If a new problem present, please set up appointment sooner than planned today.

## 2017-03-28 ENCOUNTER — Encounter: Payer: Self-pay | Admitting: Family Medicine

## 2017-03-29 ENCOUNTER — Ambulatory Visit: Payer: 59 | Admitting: Family Medicine

## 2017-04-05 ENCOUNTER — Other Ambulatory Visit: Payer: Self-pay

## 2017-04-05 MED ORDER — PRAVASTATIN SODIUM 40 MG PO TABS: 40.0000 mg | ORAL_TABLET | Freq: Every day | ORAL | 3 refills | Status: DC

## 2017-05-26 NOTE — Progress Notes (Signed)
HPI:   Rachel Love is a 56 y.o. female, who is here today for her routine physical.  Last CPE: Over a year.  Regular exercise 3 or more time per week: Not consistently. Following a healthy diet: Yes.  She lives with her husband.  Chronic medical problems: Vit D def,HTN,OA,allergic rhinitis,and HLD among some.  HLD: Currently she is on Pravachol 40 mg daily.  Lab Results  Component Value Date   CHOL 252 (H) 03/26/2017   HDL 48.10 03/26/2017   LDLCALC 193 (H) 03/26/2017   TRIG 52.0 03/26/2017   CHOLHDL 5 03/26/2017   She was seen on 03/26/17, when she was c/o right-sided numbness. Head CT w/o contract: No acute intracranial abnormality.  Symptoms resolved.  HTN: She is on Amlodipine 10 mg daily.  Lab Results  Component Value Date   CREATININE 0.69 03/22/2017   BUN 14 03/22/2017   NA 138 03/22/2017   K 3.7 03/22/2017   CL 105 03/22/2017   CO2 26 03/22/2017    Pap smear 2-3 years. Hx of abnormal pap smears: Denies Hx of STD's: Denies.  Immunization History  Administered Date(s) Administered  . Influenza,inj,Quad PF,6+ Mos 05/27/2017   Last Tdap less than 10 years ago.  Mammogram: 04/2016 [facility] 1 Colonoscopy: at 50 years, and 10 years follow up.   Hep C screening: She has not had it done before and would like to do so.  Concerns today.   Dispareunia for about a year, stable. No vaginal bleeding or discharge. She has used OTC lubricants but they do not seem to help. + Vaginal dryness.   Review of Systems  Constitutional: Negative for appetite change, fatigue, fever and unexpected weight change.  HENT: Negative for dental problem, hearing loss, nosebleeds, trouble swallowing and voice change.   Eyes: Negative for redness and visual disturbance.  Respiratory: Negative for cough, shortness of breath and wheezing.   Cardiovascular: Negative for chest pain and leg swelling.  Gastrointestinal: Negative for abdominal pain, blood in  stool, nausea and vomiting.       No changes in bowel habits.  Endocrine: Negative for cold intolerance, heat intolerance, polydipsia, polyphagia and polyuria.  Genitourinary: Positive for dyspareunia. Negative for decreased urine volume, dysuria, hematuria, menstrual problem, pelvic pain, vaginal bleeding and vaginal discharge.       No breast tenderness or nipple discharge.  Musculoskeletal: Positive for arthralgias and back pain (chronic). Negative for gait problem.  Skin: Negative for rash.  Allergic/Immunologic: Positive for environmental allergies.  Neurological: Negative for syncope, weakness, numbness and headaches.  Hematological: Negative for adenopathy. Does not bruise/bleed easily.  Psychiatric/Behavioral: Negative for confusion and sleep disturbance. The patient is not nervous/anxious.   All other systems reviewed and are negative.     Current Outpatient Medications on File Prior to Visit  Medication Sig Dispense Refill  . amLODipine (NORVASC) 10 MG tablet Take 1 tablet (10 mg total) by mouth daily. 90 tablet 2  . Chlorphen-PE-Acetaminophen (NOREL AD PO) Take 1 tablet by mouth every 6 hours as needed for congestion.    . fluticasone (FLONASE) 50 MCG/ACT nasal spray Place 1 spray into both nostrils 2 (two) times daily. 16 g 3  . pravastatin (PRAVACHOL) 40 MG tablet Take 1 tablet (40 mg total) by mouth daily with supper. 30 tablet 3   No current facility-administered medications on file prior to visit.      Past Medical History:  Diagnosis Date  . Chicken pox   . Hyperlipidemia   .  Hypertension     Past Surgical History:  Procedure Laterality Date  . UTERINE FIBROID SURGERY  2003    No Known Allergies  Family History  Problem Relation Age of Onset  . Hypertension Mother   . Cancer Father        lung  . Heart disease Maternal Uncle        CAD    Social History   Socioeconomic History  . Marital status: Married    Spouse name: None  . Number of  children: None  . Years of education: None  . Highest education level: None  Social Needs  . Financial resource strain: None  . Food insecurity - worry: None  . Food insecurity - inability: None  . Transportation needs - medical: None  . Transportation needs - non-medical: None  Occupational History  . None  Tobacco Use  . Smoking status: Never Smoker  . Smokeless tobacco: Never Used  Substance and Sexual Activity  . Alcohol use: No  . Drug use: No  . Sexual activity: Yes  Other Topics Concern  . None  Social History Narrative  . None     Vitals:   05/27/17 0750  BP: 126/82  Pulse: 84  Resp: 12  Temp: 98.4 F (36.9 C)  SpO2: 96%   Body mass index is 36.64 kg/m.   Wt Readings from Last 3 Encounters:  05/27/17 227 lb (103 kg)  03/26/17 221 lb 8 oz (100.5 kg)  03/22/17 224 lb 12.8 oz (102 kg)    Physical Exam  Nursing note and vitals reviewed. Constitutional: She is oriented to person, place, and time. She appears well-developed. No distress.  HENT:  Head: Normocephalic and atraumatic.  Right Ear: Hearing, tympanic membrane, external ear and ear canal normal.  Left Ear: Hearing, tympanic membrane, external ear and ear canal normal.  Mouth/Throat: Uvula is midline, oropharynx is clear and moist and mucous membranes are normal.  Eyes: Conjunctivae and EOM are normal. Pupils are equal, round, and reactive to light.  Neck: No tracheal deviation present. No thyromegaly present.  Cardiovascular: Normal rate and regular rhythm.  No murmur heard. Pulses:      Dorsalis pedis pulses are 2+ on the right side, and 2+ on the left side.  Respiratory: Effort normal and breath sounds normal. No respiratory distress.  GI: Soft. She exhibits no mass. There is no hepatomegaly. There is no tenderness.  Genitourinary: No breast swelling or tenderness. There is no rash, tenderness or lesion on the right labia. There is no rash, tenderness or lesion on the left labia. Uterus is not  enlarged and not tender. Cervix exhibits discharge (mild,whitish). Cervix exhibits no motion tenderness. Right adnexum displays no mass, no tenderness and no fullness. Left adnexum displays no mass, no tenderness and no fullness. No erythema or bleeding in the vagina. No vaginal discharge found.  Genitourinary Comments: Breast: No masses,skin changes, or nipple discharge. Pap smear collected.  Musculoskeletal: She exhibits edema (Trace pitting LE edema, bilateral.). She exhibits no tenderness.  No major deformity or signs of synovitis appreciated. Lower back pain while on examination table.  Lymphadenopathy:    She has no cervical adenopathy.    She has no axillary adenopathy.       Right: No inguinal and no supraclavicular adenopathy present.       Left: No inguinal and no supraclavicular adenopathy present.  Neurological: She is alert and oriented to person, place, and time. She has normal strength. No cranial nerve deficit.  Coordination and gait normal.  Reflex Scores:      Bicep reflexes are 2+ on the right side and 2+ on the left side.      Patellar reflexes are 2+ on the right side and 2+ on the left side. Skin: Skin is warm. No rash noted. No erythema.  Psychiatric: Her affect is blunt. Cognition and memory are normal.  Well groomed, poor eye contact.     ASSESSMENT AND PLAN:   Ms. Minna AntisLorna was seen today for annual exam.  Diagnoses and all orders for this visit:  Lab Results  Component Value Date   CHOL 252 (H) 03/26/2017   HDL 48.10 03/26/2017   LDLCALC 193 (H) 03/26/2017   TRIG 52.0 03/26/2017   CHOLHDL 5 03/26/2017   Lab Results  Component Value Date   CREATININE 0.78 05/27/2017   BUN 19 05/27/2017   NA 140 05/27/2017   K 4.1 05/27/2017   CL 105 05/27/2017   CO2 26 05/27/2017   Lab Results  Component Value Date   HGBA1C 6.2 05/27/2017    Routine general medical examination at a health care facility  We discussed the importance of regular physical activity  and healthy diet for prevention of chronic illness and/or complications. Preventive guidelines reviewed. Vaccination reported as up to date.  Ca++ and vit D supplementation recommended. Next CPE in a year.  The 10-year ASCVD risk score Denman George(Goff DC Montez HagemanJr., et al., 2013) is: 6.9%   Values used to calculate the score:     Age: [age] years     Sex: Female     Is Non-Hispanic African American: Yes     Diabetic: No     Tobacco smoker: No     Systolic Blood Pressure: 126 mmHg     Is BP treated: Yes     HDL Cholesterol: 48.1 mg/dL     Total Cholesterol: 252 mg/dL  Encounter for HCV screening test for high risk patient -     Hepatitis C antibody screen  Diabetes mellitus screening -     Hemoglobin A1c -     Basic metabolic panel  Vaginal atrophy  Educated about Dx and treatment options. She agrees with topical Premarin 2-3 times per week. F/U in 6 months.  -     conjugated estrogens (PREMARIN) vaginal cream; Place 1 Applicatorful vaginally 3 (three) times a week.  Cervical cancer screening -     Cytology - PAP ([place])  Need for influenza vaccination -     Flu Vaccine QUAD 36+ mos IM      Return in 6 months (on 11/24/2017) for HLD,wt,vaginal atrophy.     Betty G. Swazil[place], MD  [facility]. [place] office.

## 2017-05-27 ENCOUNTER — Encounter: Payer: Self-pay | Admitting: Family Medicine

## 2017-05-27 ENCOUNTER — Other Ambulatory Visit (HOSPITAL_COMMUNITY)
Admission: RE | Admit: 2017-05-27 | Discharge: 2017-05-27 | Disposition: A | Discharge status: Home / Self Care | Payer: 59 | Source: Ambulatory Visit | Attending: Family Medicine | Admitting: Family Medicine

## 2017-05-27 ENCOUNTER — Ambulatory Visit (INDEPENDENT_AMBULATORY_CARE_PROVIDER_SITE_OTHER): Payer: 59 | Admitting: Family Medicine

## 2017-05-27 VITALS — BP 126/82 | HR 84 | Temp 98.4°F | Resp 12 | Ht 66.0 in | Wt 227.0 lb

## 2017-05-27 DIAGNOSIS — Z131 Encounter for screening for diabetes mellitus: Secondary | ICD-10-CM | POA: Diagnosis not present

## 2017-05-27 DIAGNOSIS — Z124 Encounter for screening for malignant neoplasm of cervix: Secondary | ICD-10-CM

## 2017-05-27 DIAGNOSIS — Z0000 Encounter for general adult medical examination without abnormal findings: Secondary | ICD-10-CM

## 2017-05-27 DIAGNOSIS — Z1159 Encounter for screening for other viral diseases: Secondary | ICD-10-CM | POA: Diagnosis not present

## 2017-05-27 DIAGNOSIS — N952 Postmenopausal atrophic vaginitis: Secondary | ICD-10-CM

## 2017-05-27 DIAGNOSIS — Z23 Encounter for immunization: Secondary | ICD-10-CM

## 2017-05-27 DIAGNOSIS — Z9189 Other specified personal risk factors, not elsewhere classified: Secondary | ICD-10-CM

## 2017-05-27 LAB — HEMOGLOBIN A1C: HEMOGLOBIN A1C: 6.2 % (ref 4.6–6.5)

## 2017-05-27 LAB — BASIC METABOLIC PANEL
BUN: 19 mg/dL (ref 6–23)
CHLORIDE: 105 meq/L (ref 96–112)
CO2: 26 mEq/L (ref 19–32)
CREATININE: 0.78 mg/dL (ref 0.40–1.20)
Calcium: 9.4 mg/dL (ref 8.4–10.5)
GFR: 98.24 mL/min (ref >60.00)
Glucose, Bld: 107 mg/dL — ABNORMAL HIGH (ref 70–99)
POTASSIUM: 4.1 meq/L (ref 3.5–5.1)
Sodium: 140 mEq/L (ref 135–145)

## 2017-05-27 MED ORDER — ESTROGENS, CONJUGATED 0.625 MG/GM VA CREA: 1.0000 | TOPICAL_CREAM | VAGINAL | 3 refills | Status: DC

## 2017-05-27 NOTE — Patient Instructions (Addendum)
A few things to remember from today's visit:   Routine general medical examination at a health care facility  Encounter for HCV screening test for high risk patient - Plan: Hepatitis C antibody screen  Diabetes mellitus screening - Plan: Hemoglobin A1c, Basic metabolic panel  Vaginal atrophy - Plan: conjugated estrogens (PREMARIN) vaginal cream  Cervical cancer screening - Plan: Cytology - PAP ([place])  Today you have you routine preventive visit.  At least 150 minutes of moderate exercise per week, daily brisk walking for 15-30 min is a good exercise option. Healthy diet low in saturated (animal) fats and sweets and consisting of fresh fruits and vegetables, lean meats such as fish and white chicken and whole grains.  These are some of recommendations for screening depending of age and risk factors:   Atrophic Vaginitis Atrophic vaginitis is when the tissues that line the vagina become dry and thin. This is caused by a drop in estrogen. Estrogen helps:  To keep the vagina moist.  To make a clear fluid that helps: ? To lubricate the vagina for sex. ? To protect the vagina from infection.  If the lining of the vagina is dry and thin, it may:  Make sex painful. It may also cause bleeding.  Cause a feeling of: ? Burning. ? Irritation. ? Itchiness.  Make an exam of your vagina painful. It may also cause bleeding.  Make you lose interest in sex.  Cause a burning feeling when you pee.  Make your vaginal fluid (discharge) brown or yellow.  For some women, there are no symptoms. This condition is most common in women who do not get their regular menstrual periods anymore (menopause). This often starts when a woman is 6745-56 years old. Follow these instructions at home:  Take medicines only as told by your doctor. Do not use any herbal or alternative medicines unless your doctor says it is okay.  Use over-the-counter products for dryness only as told by your doctor.  These include: ? Creams. ? Lubricants. ? Moisturizers.  Do not douche.  Do not use products that can make your vagina dry. These include: ? Scented feminine sprays. ? Scented tampons. ? Scented soaps.  If it hurts to have sex, tell your sexual partner. Contact a doctor if:  Your discharge looks different than normal.  Your vagina has an unusual smell.  You have new symptoms.  Your symptoms do not get better with treatment.  Your symptoms get worse. This information is not intended to replace advice given to you by your health care provider. Make sure you discuss any questions you have with your health care provider. Document Released: 12/05/2007 Document Revised: 11/24/2015 Document Reviewed: 06/09/2014 Elsevier Interactive Patient Education  2018 [employer]  - Vaccines:  Tdap vaccine every 10 years.  Shingles vaccine recommended at age 56, could be given after 56 years of age but not sure about insurance coverage.   Pneumonia vaccines:  Prevnar 13 at 65 and Pneumovax at 66. Sometimes Pneumovax is giving earlier if history of smoking, lung disease,diabetes,kidney disease among some.    Screening for diabetes at age 56 and every 3 years.  Cervical cancer prevention:   Pap smear every 3-5 years between women 30 and older if pap smear negative and HPV screening negative.   -Breast cancer: Mammogram: There is disagreement between experts about when to start screening in low risk asymptomatic female but recent recommendations are to start screening at 3240 and not later than 56 years old , every  1-2 years and after 56 yo q 2 years. Screening is recommended until 56 years old but some women can continue screening depending of healthy issues.   Colon cancer screening: starts at 56 years old until 56 years old.   Also recommended:  1. Dental visit- Brush and floss your teeth twice daily; visit your dentist twice a year. 2. Eye doctor- Get an eye exam at least every 2  years. 3. Helmet use- Always wear a helmet when riding a bicycle, motorcycle, rollerblading or skateboarding. 4. Safe sex- If you may be exposed to sexually transmitted infections, use a condom. 5. Seat belts- Seat belts can save your live; always wear one. 6. Smoke/Carbon Monoxide detectors- These detectors need to be installed on the appropriate level of your home. Replace batteries at least once a year. 7. Skin cancer- When out in the sun please cover up and use sunscreen 15 SPF or higher. 8. Violence- If anyone is threatening or hurting you, please tell your healthcare provider.  9. Drink alcohol in moderation- Limit alcohol intake to one drink or less per day. Never drink and drive.  Please be sure medication list is accurate. If a new problem present, please set up appointment sooner than planned today.

## 2017-05-28 ENCOUNTER — Other Ambulatory Visit: Payer: Self-pay | Admitting: Family Medicine

## 2017-05-28 DIAGNOSIS — Z1231 Encounter for screening mammogram for malignant neoplasm of breast: Secondary | ICD-10-CM

## 2017-05-28 LAB — HEPATITIS C ANTIBODY
Hepatitis C Ab: NONREACTIVE
SIGNAL TO CUT-OFF: 0.01 (ref <1.00)

## 2017-05-28 LAB — CYTOLOGY - PAP
Diagnosis: NEGATIVE
HPV (WINDOPATH): NOT DETECTED

## 2017-05-31 ENCOUNTER — Ambulatory Visit: Payer: 59 | Admitting: Family Medicine

## 2017-06-24 ENCOUNTER — Ambulatory Visit
Admission: RE | Admit: 2017-06-24 | Discharge: 2017-06-24 | Disposition: A | Discharge status: Home / Self Care | Payer: 59 | Source: Ambulatory Visit | Attending: Family Medicine | Admitting: Family Medicine

## 2017-06-24 DIAGNOSIS — Z1231 Encounter for screening mammogram for malignant neoplasm of breast: Secondary | ICD-10-CM | POA: Diagnosis not present

## 2017-10-01 ENCOUNTER — Other Ambulatory Visit: Payer: Self-pay | Admitting: Family Medicine

## 2017-11-02 ENCOUNTER — Other Ambulatory Visit: Payer: Self-pay | Admitting: Family Medicine

## 2017-11-02 DIAGNOSIS — I10 Essential (primary) hypertension: Secondary | ICD-10-CM

## 2017-11-26 ENCOUNTER — Ambulatory Visit: Payer: 59 | Admitting: Family Medicine

## 2017-12-02 NOTE — Progress Notes (Signed)
HPI:   [name] is a 57 y.o. female, who is here today for 6 months follow up.   She was last seen on 03/26/17 for her CPE.   Hyperlipidemia:  Currently on Pravastatin 40 mg. Following a low fat diet: Yes.  In general tolerating medication well.  Lab Results  Component Value Date   CHOL 252 (H) 03/26/2017   HDL 48.10 03/26/2017   LDLCALC 193 (H) 03/26/2017   TRIG 52.0 03/26/2017   CHOLHDL 5 03/26/2017     Hypertension:    Currently on Amlodipine 10 mg daily.   Last eye exam 06/2016. Home BPs low 110s/70s.  She is taking medications as instructed, no side effects reported.  She has not noted unusual headache, visual changes, exertional chest pain, dyspnea,  focal weakness, or edema.   Lab Results  Component Value Date   CREATININE 0.78 05/27/2017   BUN 19 05/27/2017   NA 140 05/27/2017   K 4.1 05/27/2017   CL 105 05/27/2017   CO2 26 05/27/2017     Today she is complaining of right-sided waist pain, sore, 1/10, intermittently.  Exacerbated by lying on right side. Alleviated by changing positions. No history of trauma. Pain started around 06/2017, initially she thought it was related to new mattress. No limitation of ROM.  In the past she has seen chiropractor due to right hip issues.  She has been taking Aleve sometimes.   She has been exercising regularly 3-4 times per week, treadmill,elliptical,and wt lifting.    Review of Systems  Constitutional: Negative for activity change, appetite change, fatigue, fever and unexpected weight change.  HENT: Negative for mouth sores, nosebleeds and trouble swallowing.   Eyes: Negative for redness and visual disturbance.  Respiratory: Negative for cough, shortness of breath and wheezing.   Cardiovascular: Negative for chest pain, palpitations and leg swelling.  Gastrointestinal: Negative for abdominal pain, nausea and vomiting.       Negative for changes in bowel habits.    Genitourinary: Negative for decreased urine volume and hematuria.  Musculoskeletal: Positive for myalgias. Negative for gait problem.  Skin: Negative for rash and wound.  Neurological: Negative for syncope, weakness and headaches.     Current Outpatient Medications on File Prior to Visit  Medication Sig Dispense Refill  . amLODipine (NORVASC) 10 MG tablet TAKE 1 TABLET(10 MG) BY MOUTH DAILY 90 tablet 0  . Chlorphen-PE-Acetaminophen (NOREL AD PO) Take 1 tablet by mouth every 6 hours as needed for congestion.    . conjugated estrogens (PREMARIN) vaginal cream Place 1 Applicatorful vaginally 3 (three) times a week. 42.5 g 3  . fluticasone (FLONASE) 50 MCG/ACT nasal spray Place 1 spray into both nostrils 2 (two) times daily. 16 g 3  . pravastatin (PRAVACHOL) 40 MG tablet TAKE 1 TABLET(40 MG) BY MOUTH DAILY WITH SUPPER 30 tablet 0   No current facility-administered medications on file prior to visit.      Past Medical History:  Diagnosis Date  . Chicken pox   . Hyperlipidemia   . Hypertension    No Known Allergies  Social History   Socioeconomic History  . Marital status: Married    Spouse name: Not on file  . Number of children: Not on file  . Years of education: Not on file  . Highest education level: Not on file  Occupational History  . Not on file  Social Needs  . Financial resource strain: Not on file  . Food insecurity:  Worry: Not on file    Inability: Not on file  . Transportation needs:    Medical: Not on file    Non-medical: Not on file  Tobacco Use  . Smoking status: Never Smoker  . Smokeless tobacco: Never Used  Substance and Sexual Activity  . Alcohol use: No  . Drug use: No  . Sexual activity: Yes  Lifestyle  . Physical activity:    Days per week: Not on file    Minutes per session: Not on file  . Stress: Not on file  Relationships  . Social connections:    Talks on phone: Not on file    Gets together: Not on file    Attends religious service:  Not on file    Active member of club or organization: Not on file    Attends meetings of clubs or organizations: Not on file    Relationship status: Not on file  Other Topics Concern  . Not on file  Social History Narrative  . Not on file    Vitals:   12/03/17 0724  BP: 124/78  Pulse: 80  Resp: 12  Temp: 97.6 F (36.4 C)  SpO2: 99%   Body mass index is 34.22 kg/m.  Wt Readings from Last 3 Encounters:  12/03/17 212 lb (96.2 kg)  05/27/17 227 lb (103 kg)  03/26/17 221 lb 8 oz (100.5 kg)     Physical Exam  Nursing note and vitals reviewed. Constitutional: She is oriented to person, place, and time. She appears well-developed. No distress.  HENT:  Head: Normocephalic and atraumatic.  Mouth/Throat: Oropharynx is clear and moist and mucous membranes are normal.  Eyes: Pupils are equal, round, and reactive to light. Conjunctivae are normal.  Cardiovascular: Normal rate and regular rhythm.  No murmur heard. Pulses:      Dorsalis pedis pulses are 2+ on the right side, and 2+ on the left side.  Respiratory: Effort normal and breath sounds normal. No respiratory distress.  GI: Soft. She exhibits no mass. There is no hepatomegaly. There is no tenderness.  Musculoskeletal: She exhibits no edema.       Lumbar back: She exhibits no bony tenderness.       Back:  Tenderness upon palpation of left lumbar muscles. No skin changes appreciated.  Lymphadenopathy:    She has no cervical adenopathy.  Neurological: She is alert and oriented to person, place, and time. She has normal strength. Gait normal.  Skin: Skin is warm. No erythema.  Psychiatric: She has a normal mood and affect.  Well groomed, good eye contact.       ASSESSMENT AND PLAN:   Ms. Rachel Love was seen today for 6 months follow-up.  Orders Placed This Encounter  Procedures  . DG Ribs Unilateral W/Chest Right  . Comprehensive metabolic panel  . Lipid panel  . Urinalysis, Routine w reflex  microscopic   Lab Results  Component Value Date   CHOL 196 12/03/2017   HDL 44.40 12/03/2017   LDLCALC 140 (H) 12/03/2017   TRIG 56.0 12/03/2017   CHOLHDL 4 12/03/2017   Lab Results  Component Value Date   ALT 11 12/03/2017   AST 12 12/03/2017   ALKPHOS 97 12/03/2017   BILITOT 0.5 12/03/2017   Lab Results  Component Value Date   CREATININE 0.62 12/03/2017   BUN 17 12/03/2017   NA 142 12/03/2017   K 3.9 12/03/2017   CL 107 12/03/2017   CO2 26 12/03/2017    Hypertension, essential,  benign Adequately controlled. No changes in current management. Low-salt diet to continue.. Eye exam recommended annually. F/U in 6 months, before if needed.   Hyperlipidemia No changes in current management, will follow labs done today and will give further recommendations accordingly. Follow-up in 6 to 12 months, depending of lipid panel results.  Class 1 obesity with body mass index (BMI) of 34.0 to 34.9 in adult BMI went from 37-34.2. She has lost about 15 pounds since her last visit in 05/2017. Encouraged to continue a healthy diet and regular physical activity.   Costal margin pain  Seems muscular pain. She would like to have imaging done, rib X ray ordered. UA and LFT's. Further recommendations will be given according to labs/imaging results. Topical OTC Icy hot with Lidocaine may help.     Lela Gell G. [place], MD  [facility]. [place] office.

## 2017-12-03 ENCOUNTER — Encounter: Payer: Self-pay | Admitting: Family Medicine

## 2017-12-03 ENCOUNTER — Ambulatory Visit (INDEPENDENT_AMBULATORY_CARE_PROVIDER_SITE_OTHER): Payer: 59 | Admitting: Family Medicine

## 2017-12-03 ENCOUNTER — Ambulatory Visit (INDEPENDENT_AMBULATORY_CARE_PROVIDER_SITE_OTHER)
Admission: RE | Admit: 2017-12-03 | Discharge: 2017-12-03 | Disposition: A | Discharge status: Home / Self Care | Payer: 59 | Source: Ambulatory Visit | Attending: Family Medicine | Admitting: Family Medicine

## 2017-12-03 VITALS — BP 124/78 | HR 80 | Temp 97.6°F | Resp 12 | Ht 66.0 in | Wt 212.0 lb

## 2017-12-03 DIAGNOSIS — Z6834 Body mass index (BMI) 34.0-34.9, adult: Secondary | ICD-10-CM | POA: Diagnosis not present

## 2017-12-03 DIAGNOSIS — E669 Obesity, unspecified: Secondary | ICD-10-CM | POA: Diagnosis not present

## 2017-12-03 DIAGNOSIS — I10 Essential (primary) hypertension: Secondary | ICD-10-CM

## 2017-12-03 DIAGNOSIS — R0781 Pleurodynia: Secondary | ICD-10-CM

## 2017-12-03 DIAGNOSIS — R0789 Other chest pain: Secondary | ICD-10-CM | POA: Diagnosis not present

## 2017-12-03 DIAGNOSIS — E785 Hyperlipidemia, unspecified: Secondary | ICD-10-CM | POA: Diagnosis not present

## 2017-12-03 LAB — COMPREHENSIVE METABOLIC PANEL
ALK PHOS: 97 U/L (ref 39–117)
ALT: 11 U/L (ref 0–35)
AST: 12 U/L (ref 0–37)
Albumin: 3.8 g/dL (ref 3.5–5.2)
BUN: 17 mg/dL (ref 6–23)
CO2: 26 mEq/L (ref 19–32)
Calcium: 9.2 mg/dL (ref 8.4–10.5)
Chloride: 107 mEq/L (ref 96–112)
Creatinine, Ser: 0.62 mg/dL (ref 0.40–1.20)
GFR: 127.8 mL/min (ref >60.00)
Glucose, Bld: 90 mg/dL (ref 70–99)
Potassium: 3.9 mEq/L (ref 3.5–5.1)
Sodium: 142 mEq/L (ref 135–145)
TOTAL PROTEIN: 6.7 g/dL (ref 6.0–8.3)
Total Bilirubin: 0.5 mg/dL (ref 0.2–1.2)

## 2017-12-03 LAB — URINALYSIS, ROUTINE W REFLEX MICROSCOPIC
BILIRUBIN URINE: NEGATIVE
Hgb urine dipstick: NEGATIVE
KETONES UR: NEGATIVE
LEUKOCYTES UA: NEGATIVE
NITRITE: NEGATIVE
PH: 6 (ref 5.0–8.0)
RBC / HPF: NONE SEEN (ref 0–?)
Specific Gravity, Urine: 1.015 (ref 1.000–1.030)
TOTAL PROTEIN, URINE-UPE24: NEGATIVE
Urine Glucose: NEGATIVE
Urobilinogen, UA: 0.2 (ref 0.0–1.0)

## 2017-12-03 LAB — LIPID PANEL
Cholesterol: 196 mg/dL (ref 0–200)
HDL: 44.4 mg/dL (ref >39.00)
LDL Cholesterol: 140 mg/dL — ABNORMAL HIGH (ref 0–99)
NONHDL: 151.28
Total CHOL/HDL Ratio: 4
Triglycerides: 56 mg/dL (ref 0.0–149.0)
VLDL: 11.2 mg/dL (ref 0.0–40.0)

## 2017-12-03 NOTE — Assessment & Plan Note (Signed)
No changes in current management, will follow labs done today and will give further recommendations accordingly. Follow-up in 6 to 12 months, depending of lipid panel results. 

## 2017-12-03 NOTE — Assessment & Plan Note (Signed)
Adequately controlled. No changes in current management. Low salt diet to continue. Eye exam recommended annually. F/U in 6 months, before if needed.  

## 2017-12-03 NOTE — Assessment & Plan Note (Signed)
BMI went from 37-34.2. She has lost about 15 pounds since her last visit in 05/2017. Encouraged to continue a healthy diet and regular physical activity.

## 2017-12-03 NOTE — Patient Instructions (Addendum)
A few things to remember from today's visit:   Hyperlipidemia, unspecified hyperlipidemia type - Plan: Comprehensive metabolic panel, Lipid panel  Hypertension, essential, benign - Plan: Comprehensive metabolic panel, Urinalysis, Routine w reflex microscopic  Costal margin pain - Plan: DG Ribs Unilateral W/Chest Right, Urinalysis, Routine w reflex microscopic  No changes today.  Today X ray was ordered.  This can be done at [facility] between 8 am and 5 pm: [address] [phone].    Please be sure medication list is accurate. If a new problem present, please set up appointment sooner than planned today.

## 2018-01-06 ENCOUNTER — Other Ambulatory Visit: Payer: Self-pay | Admitting: Family Medicine

## 2018-01-10 DIAGNOSIS — L509 Urticaria, unspecified: Secondary | ICD-10-CM | POA: Diagnosis not present

## 2018-01-13 ENCOUNTER — Telehealth: Payer: Self-pay | Admitting: Family Medicine

## 2018-01-13 NOTE — Telephone Encounter (Signed)
Copied from CRM #[id number]. [phone]: Quick Communication - See Telephone Encounter >> Jan 13, 2018  2:04 PM Luanna Coleawoud, Jessica L wrote: CRM for notification. See Telephone encounter for: 01/13/18. Pt is calling and stated that she is still having lower right pain and believes that it could be her kidneys. Pt can not sleep on right side. Pt was wandering if kidney blood work has been done and what exactly it included. Please advise

## 2018-01-13 NOTE — Telephone Encounter (Signed)
Please advise 

## 2018-01-13 NOTE — Telephone Encounter (Signed)
Spoke with patient and informed there that all her lab work came back normal. Patient stated that she is very tender on her right side and that she just wanted to make sure that her kidneys were okay. She stated that she would look into seeing a Chiropractor because she was told that he may be a muscle or cartilage.

## 2018-01-17 ENCOUNTER — Other Ambulatory Visit: Payer: Self-pay | Admitting: Family Medicine

## 2018-01-17 DIAGNOSIS — R1011 Right upper quadrant pain: Secondary | ICD-10-CM

## 2018-01-17 NOTE — Telephone Encounter (Signed)
Certainly pain can be musculoskeletal but we also need to consider gallbladder disease,?  Cholelithiasis. I just place order for RUQ US. If pain becomes severe or she develops fever she needs to seek immediate medical attention.  Thanks, BJ

## 2018-01-17 NOTE — Telephone Encounter (Signed)
Spoke with patient and informed her that US was placed and gave instructions to watch for symptoms. Patient verbalized understanding.

## 2018-01-23 ENCOUNTER — Ambulatory Visit
Admission: RE | Admit: 2018-01-23 | Discharge: 2018-01-23 | Disposition: A | Discharge status: Home / Self Care | Payer: 59 | Source: Ambulatory Visit | Attending: Family Medicine | Admitting: Family Medicine

## 2018-01-23 DIAGNOSIS — R1011 Right upper quadrant pain: Secondary | ICD-10-CM

## 2018-01-27 ENCOUNTER — Encounter: Payer: Self-pay | Admitting: Family Medicine

## 2018-01-27 ENCOUNTER — Ambulatory Visit (INDEPENDENT_AMBULATORY_CARE_PROVIDER_SITE_OTHER): Payer: 59 | Admitting: Family Medicine

## 2018-01-27 VITALS — BP 124/82 | HR 88 | Temp 98.3°F | Resp 12 | Ht 66.0 in | Wt 216.2 lb

## 2018-01-27 DIAGNOSIS — K760 Fatty (change of) liver, not elsewhere classified: Secondary | ICD-10-CM | POA: Diagnosis not present

## 2018-01-27 DIAGNOSIS — M546 Pain in thoracic spine: Secondary | ICD-10-CM

## 2018-01-27 DIAGNOSIS — L509 Urticaria, unspecified: Secondary | ICD-10-CM | POA: Diagnosis not present

## 2018-01-27 MED ORDER — CYCLOBENZAPRINE HCL 10 MG PO TABS
10.0000 mg | ORAL_TABLET | Freq: Every day | ORAL | 0 refills | Status: DC
Start: 2018-01-27 — End: 2018-02-28

## 2018-01-27 MED ORDER — NAPROXEN 500 MG PO TABS: 500.0000 mg | ORAL_TABLET | Freq: Two times a day (BID) | ORAL | 0 refills | Status: AC

## 2018-01-27 NOTE — Progress Notes (Signed)
HPI:   [name] is a 57 y.o. female, who is here today c/o existing right rib cage pain. She has had pain since 06/2017. No history of trauma. She describes pain as "nagging pain, uncomfortable", it is not sharp or achy. Pain is exacerbated by certain movements and by sleeping on her right side. It is intermittently, mainly at night but has had it sometimes during the day when she is sitting in her desk. She has not noted rash or skin changes, no deformities. No numbness or tingling. No respiratory symptoms.  Pain seems to be better when she exercises.  Pain is not related with food intake. Recently she had a RUQ ZO:XWRUS:Mild (01/23/2018) hepatic heterogeneity which may be related to fatty infiltration  She is also complaining about 3 months of intermittent pruritic skin rash "hives."  She started with a rash again 2 to 3 days ago on the left arm. She is not sure if lesions are caused by a mosquito bites.  She has not identified exacerbating or alleviating factors.  She has several episodes per month.  No associated fever, chills, lives/oral mucosa edema, dysphagia, odynophagia, cough, wheezing, nausea, abdominal pain, or vomiting. Negative for new medications, new detergents or soaps, or sick contact. She is not aware of tick or insect bites.  She has not tried OTC medications.  She was seen in acute care on 01/11/2018. She has been treated with oral prednisone, she wonders if this is a reaction to medication.   Review of Systems  Constitutional: Negative for appetite change, chills, fatigue and fever.  HENT: Negative for congestion, mouth sores, sneezing, sore throat, trouble swallowing and voice change.   Eyes: Negative for discharge and redness.  Respiratory: Negative for cough, shortness of breath, wheezing and stridor.   Cardiovascular: Negative for chest pain, palpitations and leg swelling.  Gastrointestinal: Negative for abdominal pain, diarrhea,  nausea and vomiting.  Genitourinary: Negative for decreased urine volume and dysuria.  Musculoskeletal: Positive for myalgias. Negative for gait problem.  Skin: Positive for rash. Negative for wound.  Allergic/Immunologic: Negative for environmental allergies.  Neurological: Negative for weakness, numbness and headaches.  Hematological: Negative for adenopathy. Does not bruise/bleed easily.  Psychiatric/Behavioral: Negative for confusion. The patient is nervous/anxious.       Current Outpatient Medications on File Prior to Visit  Medication Sig Dispense Refill  . amLODipine (NORVASC) 10 MG tablet TAKE 1 TABLET(10 MG) BY MOUTH DAILY 90 tablet 0  . Chlorphen-PE-Acetaminophen (NOREL AD PO) Take 1 tablet by mouth every 6 hours as needed for congestion.    . conjugated estrogens (PREMARIN) vaginal cream Place 1 Applicatorful vaginally 3 (three) times a week. 42.5 g 3  . fluticasone (FLONASE) 50 MCG/ACT nasal spray Place 1 spray into both nostrils 2 (two) times daily. 16 g 3  . pravastatin (PRAVACHOL) 40 MG tablet TAKE 1 TABLET(40 MG) BY MOUTH DAILY WITH SUPPER 90 tablet 3   No current facility-administered medications on file prior to visit.      Past Medical History:  Diagnosis Date  . Chicken pox   . Hyperlipidemia   . Hypertension    Allergies  Allergen Reactions  . Other     Social History   Socioeconomic History  . Marital status: Married    Spouse name: Not on file  . Number of children: Not on file  . Years of education: Not on file  . Highest education level: Not on file  Occupational History  . Not  on file  Social Needs  . Financial resource strain: Not on file  . Food insecurity:    Worry: Not on file    Inability: Not on file  . Transportation needs:    Medical: Not on file    Non-medical: Not on file  Tobacco Use  . Smoking status: Never Smoker  . Smokeless tobacco: Never Used  Substance and Sexual Activity  . Alcohol use: No  . Drug use: No  . Sexual  activity: Yes  Lifestyle  . Physical activity:    Days per week: Not on file    Minutes per session: Not on file  . Stress: Not on file  Relationships  . Social connections:    Talks on phone: Not on file    Gets together: Not on file    Attends religious service: Not on file    Active member of club or organization: Not on file    Attends meetings of clubs or organizations: Not on file    Relationship status: Not on file  Other Topics Concern  . Not on file  Social History Narrative  . Not on file    Vitals:   01/27/18 0822  BP: 124/82  Pulse: 88  Resp: 12  Temp: 98.3 F (36.8 C)  SpO2: 98%   Body mass index is 34.9 kg/m.   Physical Exam  Nursing note and vitals reviewed. Constitutional: She is oriented to person, place, and time. She appears well-developed. No distress.  HENT:  Head: Normocephalic and atraumatic.  Mouth/Throat: Oropharynx is clear and moist and mucous membranes are normal.  Eyes: Conjunctivae are normal.  Cardiovascular: Normal rate and regular rhythm.  No murmur heard. Respiratory: Effort normal and breath sounds normal. No respiratory distress.  GI: Soft. She exhibits no mass. There is no hepatosplenomegaly. There is no tenderness.  Musculoskeletal: She exhibits edema (Trace pitting edema LE bilateral.).       Thoracic back: She exhibits no tenderness and no bony tenderness.       Lumbar back: She exhibits no tenderness and no bony tenderness.       Back:  There is no pain upon palpation of paraspinal lumbar or thoracic muscles. Mild tenderness upon palpation of posterior rib cage right side and lateral aspect, right above waist level.  No skin changes or deformities appreciated.  Lymphadenopathy:    She has no cervical adenopathy.  Neurological: She is alert and oriented to person, place, and time. She has normal strength. No cranial nerve deficit. Gait normal.  Skin: Skin is warm. Rash noted. No ecchymosis noted. Rash is not vesicular.      2 erythematosus, slightly  raised lesions on on left arm.  Lesions are not tender, 1 to 1.5 cm.  Psychiatric: She has a normal mood and affect. Her speech is normal.  Well groomed, good eye contact.    ASSESSMENT AND PLAN:  Rachel Love was seen today for tenderness in right side and urticaria.  Diagnoses and all orders for this visit:  Urticaria  Unknown etiology. We discussed a few options, including referral to immunologist vs course of oral antihistaminic. She would like to try oral antihistamine for now, recommend Zyrtec 10 mg daily for 30 days. Instructed about warning signs. If problem persist immunology referral we have been considered.  Right-sided thoracic back pain, unspecified chronicity  We discussed possible etiologies, problem seems to be stable. RUQ [place] and read with right chest otherwise negative. ?  Musculoskeletal, radicular. She had agrees with  trying treatment with NSAIDs for 7 to 10 days, muscle relaxant, and OTC Aspercreme with lidocaine. She will let me know in about 2 weeks about changes. If pain is persistent we need to consider thoracic MRI and/or Ortho referral.  -     cyclobenzaprine (FLEXERIL) 10 MG tablet; Take 1 tablet (10 mg total) by mouth at bedtime. -     naproxen (NAPROSYN) 500 MG tablet; Take 1 tablet (500 mg total) by mouth 2 (two) times daily with a meal for 10 days.  Fatty liver  We discussed abdominal ultrasound findings. Recommend healthy low-fat diet and weight loss. Continue pravastatin.      Ivy Puryear G. [place], MD  [facility]. [place] office.

## 2018-01-27 NOTE — Patient Instructions (Addendum)
A few things to remember from today's visit:   Urticaria  Right-sided thoracic back pain, unspecified chronicity - Plan: cyclobenzaprine (FLEXERIL) 10 MG tablet, naproxen (NAPROSYN) 500 MG tablet  Over-the-counter Aspercreme lidocaine on affected area. Flexeril can cause drowsiness are recommended at bedtime. Please let me know in 15 days if you are still having pain, in which case we can consider thoracic MRI or Ortho evaluation.  For hives, recommend Zyrtec 10 mg daily for 30 days.  We can also consider immunology referral.  Please be sure medication list is accurate. If a new problem present, please set up appointment sooner than planned today.

## 2018-02-18 ENCOUNTER — Other Ambulatory Visit: Payer: Self-pay | Admitting: Family Medicine

## 2018-02-18 DIAGNOSIS — I10 Essential (primary) hypertension: Secondary | ICD-10-CM

## 2018-02-24 ENCOUNTER — Telehealth: Payer: Self-pay | Admitting: Family Medicine

## 2018-02-24 NOTE — Telephone Encounter (Signed)
Copied from CRM [phone]. Topic: Quick Communication - See Telephone Encounter >> Feb 24, 2018 11:16 AM Tamela OddiMartin, [place], NT wrote: CRM for notification. See Telephone encounter for: 02/24/18. Patient called and states she needs a refill of her cyclobenzaprine (FLEXERIL) 10 MG tablet  [id number] - , [place] - [address] [facility] [address] [phone] (Phone) [phone] (Fax)

## 2018-02-24 NOTE — Telephone Encounter (Signed)
cyclobenzaprine refill Last Refill:01/27/18 # 30 Last OV: 01/27/18 PCP: Dr Chase CallerBetty Love Pharmacy: [facility] [address] [place]

## 2018-02-25 NOTE — Telephone Encounter (Signed)
Message sent to Dr. Jordan for review and approval. 

## 2018-02-28 ENCOUNTER — Other Ambulatory Visit: Payer: Self-pay | Admitting: Family Medicine

## 2018-02-28 DIAGNOSIS — M546 Pain in thoracic spine: Secondary | ICD-10-CM

## 2018-02-28 MED ORDER — CYCLOBENZAPRINE HCL 10 MG PO TABS: 10.0000 mg | ORAL_TABLET | Freq: Every day | ORAL | 1 refills | Status: DC

## 2018-02-28 NOTE — Telephone Encounter (Signed)
Rx for Flexeril sent to her pharmacy.  Thanks, BJ

## 2018-05-27 ENCOUNTER — Other Ambulatory Visit: Payer: Self-pay | Admitting: Family Medicine

## 2018-05-27 DIAGNOSIS — I10 Essential (primary) hypertension: Secondary | ICD-10-CM

## 2018-06-03 ENCOUNTER — Other Ambulatory Visit: Payer: Self-pay | Admitting: Family Medicine

## 2018-06-03 DIAGNOSIS — Z1231 Encounter for screening mammogram for malignant neoplasm of breast: Secondary | ICD-10-CM

## 2018-06-16 ENCOUNTER — Ambulatory Visit (INDEPENDENT_AMBULATORY_CARE_PROVIDER_SITE_OTHER): Payer: 59 | Admitting: Family Medicine

## 2018-06-16 ENCOUNTER — Encounter: Payer: Self-pay | Admitting: Family Medicine

## 2018-06-16 VITALS — BP 120/82 | HR 74 | Temp 98.5°F | Resp 12 | Ht 66.0 in | Wt 219.0 lb

## 2018-06-16 DIAGNOSIS — Z13228 Encounter for screening for other metabolic disorders: Secondary | ICD-10-CM

## 2018-06-16 DIAGNOSIS — E785 Hyperlipidemia, unspecified: Secondary | ICD-10-CM

## 2018-06-16 DIAGNOSIS — Z130 Encounter for screening for diseases of the blood and blood-forming organs and certain disorders involving the immune mechanism: Secondary | ICD-10-CM

## 2018-06-16 DIAGNOSIS — Z0000 Encounter for general adult medical examination without abnormal findings: Secondary | ICD-10-CM | POA: Diagnosis not present

## 2018-06-16 DIAGNOSIS — I10 Essential (primary) hypertension: Secondary | ICD-10-CM

## 2018-06-16 DIAGNOSIS — Z23 Encounter for immunization: Secondary | ICD-10-CM | POA: Diagnosis not present

## 2018-06-16 DIAGNOSIS — Z1329 Encounter for screening for other suspected endocrine disorder: Secondary | ICD-10-CM

## 2018-06-16 DIAGNOSIS — Z6834 Body mass index (BMI) 34.0-34.9, adult: Secondary | ICD-10-CM

## 2018-06-16 DIAGNOSIS — R7303 Prediabetes: Secondary | ICD-10-CM | POA: Insufficient documentation

## 2018-06-16 DIAGNOSIS — Z1211 Encounter for screening for malignant neoplasm of colon: Secondary | ICD-10-CM | POA: Diagnosis not present

## 2018-06-16 DIAGNOSIS — E6609 Other obesity due to excess calories: Secondary | ICD-10-CM

## 2018-06-16 LAB — HEMOGLOBIN A1C: Hgb A1c MFr Bld: 5.9 % (ref 4.6–6.5)

## 2018-06-16 LAB — COMPREHENSIVE METABOLIC PANEL
ALT: 13 U/L (ref 0–35)
AST: 14 U/L (ref 0–37)
Albumin: 4 g/dL (ref 3.5–5.2)
Alkaline Phosphatase: 90 U/L (ref 39–117)
BUN: 23 mg/dL (ref 6–23)
CALCIUM: 9.2 mg/dL (ref 8.4–10.5)
CO2: 25 meq/L (ref 19–32)
CREATININE: 0.73 mg/dL (ref 0.40–1.20)
Chloride: 106 mEq/L (ref 96–112)
GFR: 105.64 mL/min (ref >60.00)
Glucose, Bld: 95 mg/dL (ref 70–99)
Potassium: 3.9 mEq/L (ref 3.5–5.1)
Sodium: 140 mEq/L (ref 135–145)
Total Bilirubin: 0.5 mg/dL (ref 0.2–1.2)
Total Protein: 6.9 g/dL (ref 6.0–8.3)

## 2018-06-16 LAB — LIPID PANEL
CHOL/HDL RATIO: 4
CHOLESTEROL: 181 mg/dL (ref 0–200)
HDL: 47.2 mg/dL (ref >39.00)
LDL Cholesterol: 126 mg/dL — ABNORMAL HIGH (ref 0–99)
NonHDL: 133.39
TRIGLYCERIDES: 39 mg/dL (ref 0.0–149.0)
VLDL: 7.8 mg/dL (ref 0.0–40.0)

## 2018-06-16 LAB — TSH: TSH: 2.06 u[IU]/mL (ref 0.35–4.50)

## 2018-06-16 NOTE — Assessment & Plan Note (Signed)
Encouraged to continue regular physical activity and healthful diet.

## 2018-06-16 NOTE — Progress Notes (Signed)
HPI:   Rachel Love is a 57 y.o. female, who is here today for her routine physical.  Last CPE: 05/27/17  Regular exercise 3 or more time per week: 4 times per week: cardio and machines for the past 6 months. Following a healthy diet: Trying to eat healthier.  She lives with her husband.  Chronic medical problems: Hyperlipidemia, laryngitis, hypertension, OA, and vitamin D deficiency . Hyperlipidemia: Currently she is on pravastatin 40 mg daily.  Lab Results  Component Value Date   CHOL 196 12/03/2017   HDL 44.40 12/03/2017   LDLCALC 140 (H) 12/03/2017   TRIG 56.0 12/03/2017   CHOLHDL 4 12/03/2017   Hypertension: Currently she is on amlodipine 10 mg daily.  Lab Results  Component Value Date   HGBA1C 6.2 05/27/2017    Pap smear 05/2017 negative. Hx of abnormal pap smears: Denies. Hx of STD's: Negative.  Immunization History  Administered Date(s) Administered  . Influenza,inj,Quad PF,6+ Mos 05/27/2017, 06/16/2018    Mammogram: 06/24/18 Bi-Rads 1 Colonoscopy: She has not had colon cancer screening done. DEXA: N/A  Hep C screening: 05/2017.  She has no concerns today.   Review of Systems  Constitutional: Negative for appetite change, fatigue and fever.  HENT: Negative for dental problem, hearing loss, mouth sores, sore throat, trouble swallowing and voice change.   Eyes: Negative for redness and visual disturbance.  Respiratory: Negative for cough, shortness of breath and wheezing.   Cardiovascular: Negative for chest pain and leg swelling.  Gastrointestinal: Negative for abdominal pain, nausea and vomiting.       No changes in bowel habits.  Endocrine: Negative for cold intolerance, heat intolerance, polydipsia, polyphagia and polyuria.  Genitourinary: Negative for decreased urine volume, dysuria, hematuria, vaginal bleeding and vaginal discharge.  Musculoskeletal: Positive for arthralgias and back pain. Negative for gait problem and  myalgias.  Skin: Negative for color change and rash.  Allergic/Immunologic: Negative for environmental allergies.  Neurological: Negative for syncope, weakness and headaches.  Hematological: Negative for adenopathy. Does not bruise/bleed easily.  Psychiatric/Behavioral: Negative for confusion and sleep disturbance. The patient is not nervous/anxious.   All other systems reviewed and are negative.     Current Outpatient Medications on File Prior to Visit  Medication Sig Dispense Refill  . amLODipine (NORVASC) 10 MG tablet TAKE 1 TABLET(10 MG) BY MOUTH DAILY 90 tablet 0  . fluticasone (FLONASE) 50 MCG/ACT nasal spray Place 1 spray into both nostrils 2 (two) times daily. (Patient taking differently: Place 1 spray into both nostrils as needed. ) 16 g 3  . pravastatin (PRAVACHOL) 40 MG tablet TAKE 1 TABLET(40 MG) BY MOUTH DAILY WITH SUPPER 90 tablet 3   No current facility-administered medications on file prior to visit.      Past Medical History:  Diagnosis Date  . Chicken pox   . Hyperlipidemia   . Hypertension     Past Surgical History:  Procedure Laterality Date  . UTERINE FIBROID SURGERY  2003    Allergies  Allergen Reactions  . Other     Family History  Problem Relation Age of Onset  . Hypertension Mother   . Cancer Father        lung  . Heart disease Maternal Uncle        CAD  . Breast cancer Neg Hx     Social History   Socioeconomic History  . Marital status: Married    Spouse name: Not on file  . Number of children: Not  on file  . Years of education: Not on file  . Highest education level: Not on file  Occupational History  . Not on file  Social Needs  . Financial resource strain: Not on file  . Food insecurity:    Worry: Not on file    Inability: Not on file  . Transportation needs:    Medical: Not on file    Non-medical: Not on file  Tobacco Use  . Smoking status: Never Smoker  . Smokeless tobacco: Never Used  Substance and Sexual Activity    . Alcohol use: No  . Drug use: No  . Sexual activity: Yes  Lifestyle  . Physical activity:    Days per week: Not on file    Minutes per session: Not on file  . Stress: Not on file  Relationships  . Social connections:    Talks on phone: Not on file    Gets together: Not on file    Attends religious service: Not on file    Active member of club or organization: Not on file    Attends meetings of clubs or organizations: Not on file    Relationship status: Not on file  Other Topics Concern  . Not on file  Social History Narrative  . Not on file     Vitals:   06/16/18 0719  BP: 120/82  Pulse: 74  Resp: 12  Temp: 98.5 F (36.9 C)  SpO2: 98%   Body mass index is 35.35 kg/m.   Wt Readings from Last 3 Encounters:  06/16/18 219 lb (99.3 kg)  01/27/18 216 lb 4 oz (98.1 kg)  12/03/17 212 lb (96.2 kg)    Physical Exam  Nursing note and vitals reviewed. Constitutional: She is oriented to person, place, and time. She appears well-developed. No distress.  HENT:  Head: Normocephalic and atraumatic.  Right Ear: Hearing, tympanic membrane, external ear and ear canal normal.  Left Ear: Hearing, tympanic membrane, external ear and ear canal normal.  Mouth/Throat: Uvula is midline, oropharynx is clear and moist and mucous membranes are normal.  Eyes: Pupils are equal, round, and reactive to light. Conjunctivae and EOM are normal.  Neck: No tracheal deviation present. No thyromegaly present.  Cardiovascular: Normal rate and regular rhythm.  No murmur heard. Pulses:      Dorsalis pedis pulses are 2+ on the right side and 2+ on the left side.       Posterior tibial pulses are 2+ on the right side and 2+ on the left side.  Respiratory: Effort normal and breath sounds normal. No respiratory distress.  GI: Soft. She exhibits no mass. There is no hepatomegaly. There is no abdominal tenderness.  Genitourinary:    Genitourinary Comments: Breast: No masses, skin abnormalities, or nipple  discharge appreciated bilateral.   Musculoskeletal:        General: No edema.     Comments: No major deformity or signs of synovitis appreciated.  Lymphadenopathy:    She has no cervical adenopathy.       Right: No supraclavicular adenopathy present.       Left: No supraclavicular adenopathy present.  Neurological: She is alert and oriented to person, place, and time. She has normal strength. No cranial nerve deficit. Coordination and gait normal.  Reflex Scores:      Bicep reflexes are 2+ on the right side and 2+ on the left side.      Patellar reflexes are 2+ on the right side and 2+ on the left  side. Skin: Skin is warm. No rash noted. No erythema.  Psychiatric: She has a normal mood and affect. Cognition and memory are normal.  Well groomed, good eye contact.      ASSESSMENT AND PLAN:  Rachel Love was here today annual physical examination.  Orders Placed This Encounter  Procedures  . Flu Vaccine QUAD 36+ mos IM  . Comprehensive metabolic panel  . Lipid panel  . Hemoglobin A1c  . TSH  . Ambulatory referral to Gastroenterology    Lab Results  Component Value Date   TSH 2.06 06/16/2018   Lab Results  Component Value Date   HGBA1C 5.9 06/16/2018   Lab Results  Component Value Date   ALT 13 06/16/2018   AST 14 06/16/2018   ALKPHOS 90 06/16/2018   BILITOT 0.5 06/16/2018   Lab Results  Component Value Date   CHOL 181 06/16/2018   HDL 47.20 06/16/2018   LDLCALC 126 (H) 06/16/2018   TRIG 39.0 06/16/2018   CHOLHDL 4 06/16/2018   Lab Results  Component Value Date   CREATININE 0.73 06/16/2018   BUN 23 06/16/2018   NA 140 06/16/2018   K 3.9 06/16/2018   CL 106 06/16/2018   CO2 25 06/16/2018     Routine general medical examination at a health care facility  We discussed the importance of regular physical activity and healthy diet for prevention of chronic illness and/or complications. Preventive guidelines reviewed. Vaccination up to  date.  Ca++ and vit D supplementation recommended. Next CPE in a year.  Colon cancer screening -     Ambulatory referral to Gastroenterology  Screening for endocrine, metabolic and immunity disorder -     Comprehensive metabolic panel -     Hemoglobin A1c  Need for immunization against influenza -     Flu Vaccine QUAD 36+ mos IM   Hypertension, essential, benign BP adequately controlled. Continue low-salt diet. No changes in amlodipine 10 mg daily, we discussed some side effects. Continue monitoring BP regularly. Annual eye exam. I think it is appropriate to continue following annually, before if needed.  Hyperlipidemia No changes in current management, will follow labs done today and will give further recommendations accordingly.   Prediabetes Continue healthy lifestyle for primary prevention. Further recommendation will be given according to lab results.  Class 1 obesity with body mass index (BMI) of 34.0 to 34.9 in adult Encouraged to continue regular physical activity and healthful diet.      Return in 1 year (on 06/17/2019) for cpe and f/u .       G. [place], MD  [facility]. [place] office.

## 2018-06-16 NOTE — Assessment & Plan Note (Signed)
Continue healthy lifestyle for primary prevention. Further recommendation will be given according to lab results.

## 2018-06-16 NOTE — Patient Instructions (Addendum)
A few things to remember from today's visit:   Routine general medical examination at a health care facility  Hyperlipidemia, unspecified hyperlipidemia type - Plan: Lipid panel  Colon cancer screening - Plan: Ambulatory referral to Gastroenterology  Screening for endocrine, metabolic and immunity disorder - Plan: Comprehensive metabolic panel, Hemoglobin A1c   Please be sure medication list is accurate. If a new problem present, please set up appointment sooner than planned today.   Today you have you routine preventive visit.  At least 150 minutes of moderate exercise per week, daily brisk walking for 15-30 min is a good exercise option. Healthy diet low in saturated (animal) fats and sweets and consisting of fresh fruits and vegetables, lean meats such as fish and white chicken and whole grains.  These are some of recommendations for screening depending of age and risk factors:   - Vaccines:  Tdap vaccine every 10 years.  Shingles vaccine recommended at age 22, could be given after 57 years of age but not sure about insurance coverage.   Pneumonia vaccines:  Prevnar 13 at 65 and Pneumovax at 66. Sometimes Pneumovax is giving earlier if history of smoking, lung disease,diabetes,kidney disease among some.    Screening for diabetes at age 75 and every 3 years.  Cervical cancer prevention:  Pap smear starts at 58 years of age and continues periodically until 57 years old in low risk women. Pap smear every 3 years between 12 and 39 years old. Pap smear every 3-5 years between women 30 and older if pap smear negative and HPV screening negative.   -Breast cancer: Mammogram: There is disagreement between experts about when to start screening in low risk asymptomatic female but recent recommendations are to start screening at 55 and not later than 56 years old , every 1-2 years and after 57 yo q 2 years. Screening is recommended until 57 years old but some women can continue  screening depending of healthy issues.   Colon cancer screening: starts at 57 years old until 57 years old.   Also recommended:  1. Dental visit- Brush and floss your teeth twice daily; visit your dentist twice a year. 2. Eye doctor- Get an eye exam at least every 2 years. 3. Helmet use- Always wear a helmet when riding a bicycle, motorcycle, rollerblading or skateboarding. 4. Safe sex- If you may be exposed to sexually transmitted infections, use a condom. 5. Seat belts- Seat belts can save your live; always wear one. 6. Smoke/Carbon Monoxide detectors- These detectors need to be installed on the appropriate level of your home. Replace batteries at least once a year. 7. Skin cancer- When out in the sun please cover up and use sunscreen 15 SPF or higher. 8. Violence- If anyone is threatening or hurting you, please tell your healthcare provider.  9. Drink alcohol in moderation- Limit alcohol intake to one drink or less per day. Never drink and drive.         Why follow it? Research shows. . Those who follow the Mediterranean diet have a reduced risk of heart disease  . The diet is associated with a reduced incidence of Parkinson's and Alzheimer's diseases . People following the diet may have longer life expectancies and lower rates of chronic diseases  . The Dietary Guidelines for Americans recommends the Mediterranean diet as an eating plan to promote health and prevent disease  What Is the Mediterranean Diet?  . Healthy eating plan based on typical foods and recipes of Mediterranean-style cooking .  The diet is primarily a plant based diet; these foods should make up a majority of meals   Starches - Plant based foods should make up a majority of meals - They are an important sources of vitamins, minerals, energy, antioxidants, and fiber - Choose whole grains, foods high in fiber and minimally processed items  - Typical grain sources include wheat, oats, barley, corn, brown rice,  bulgar, farro, millet, polenta, couscous  - Various types of beans include chickpeas, lentils, fava beans, black beans, white beans   Fruits  Veggies - Large quantities of antioxidant rich fruits & veggies; 6 or more servings  - Vegetables can be eaten raw or lightly drizzled with oil and cooked  - Vegetables common to the traditional Mediterranean Diet include: artichokes, arugula, beets, broccoli, brussel sprouts, cabbage, carrots, celery, collard greens, cucumbers, eggplant, kale, leeks, lemons, lettuce, mushrooms, okra, onions, peas, peppers, potatoes, pumpkin, radishes, rutabaga, shallots, spinach, sweet potatoes, turnips, zucchini - Fruits common to the Mediterranean Diet include: apples, apricots, avocados, cherries, clementines, dates, figs, grapefruits, grapes, melons, nectarines, oranges, peaches, pears, pomegranates, strawberries, tangerines  Fats - Replace butter and margarine with healthy oils, such as olive oil, canola oil, and tahini  - Limit nuts to no more than a handful a day  - Nuts include walnuts, almonds, pecans, pistachios, pine nuts  - Limit or avoid candied, honey roasted or heavily salted nuts - Olives are central to the [employer] - can be eaten whole or used in a variety of dishes   Meats Protein - Limiting red meat: no more than a few times a month - When eating red meat: choose lean cuts and keep the portion to the size of deck of cards - Eggs: approx. 0 to 4 times a week  - Fish and lean poultry: at least 2 a week  - Healthy protein sources include, chicken, [place], lean beef, lamb - Increase intake of seafood such as tuna, salmon, trout, mackerel, shrimp, scallops - Avoid or limit high fat processed meats such as sausage and bacon  Dairy - Include moderate amounts of low fat dairy products  - Focus on healthy dairy such as fat free yogurt, skim milk, low or reduced fat cheese - Limit dairy products higher in fat such as whole or 2% milk, cheese, ice cream   Alcohol - Moderate amounts of red wine is ok  - No more than 5 oz daily for women (all ages) and men older than age 62  - No more than 10 oz of wine daily for men younger than 49  Other - Limit sweets and other desserts  - Use herbs and spices instead of salt to flavor foods  - Herbs and spices common to the traditional Mediterranean Diet include: basil, bay leaves, chives, cloves, cumin, fennel, garlic, lavender, marjoram, mint, oregano, parsley, pepper, rosemary, sage, savory, sumac, tarragon, thyme   It's not just a diet, it's a lifestyle:  . The Mediterranean diet includes lifestyle factors typical of those in the region  . Foods, drinks and meals are best eaten with others and savored . Daily physical activity is important for overall good health . This could be strenuous exercise like running and aerobics . This could also be more leisurely activities such as walking, housework, yard-work, or taking the stairs . Moderation is the key; a balanced and healthy diet accommodates most foods and drinks . Consider portion sizes and frequency of consumption of certain foods   Meal Ideas & Options:  .  Breakfast:  o Whole wheat toast or whole wheat English muffins with peanut butter & hard boiled egg o Steel cut oats topped with apples & cinnamon and skim milk  o Fresh fruit: banana, strawberries, melon, berries, peaches  o Smoothies: strawberries, bananas, greek yogurt, peanut butter o Low fat greek yogurt with blueberries and granola  o Egg white omelet with spinach and mushrooms o Breakfast couscous: whole wheat couscous, apricots, skim milk, cranberries  . Sandwiches:  o Hummus and grilled vegetables (peppers, zucchini, squash) on whole wheat bread   o Grilled chicken on whole wheat pita with lettuce, tomatoes, cucumbers or tzatziki  o Tuna salad on whole wheat bread: tuna salad made with greek yogurt, olives, red peppers, capers, green onions o Garlic rosemary lamb pita: lamb sauted  with garlic, rosemary, salt & pepper; add lettuce, cucumber, greek yogurt to pita - flavor with lemon juice and black pepper  . Seafood:  o Mediterranean grilled salmon, seasoned with garlic, basil, parsley, lemon juice and black pepper o Shrimp, lemon, and spinach whole-grain pasta salad made with low fat greek yogurt  o Seared scallops with lemon orzo  o Seared tuna steaks seasoned salt, pepper, coriander topped with tomato mixture of olives, tomatoes, olive oil, minced garlic, parsley, green onions and cappers  . Meats:  o Herbed greek chicken salad with kalamata olives, cucumber, feta  o Red bell peppers stuffed with spinach, bulgur, lean ground beef (or lentils) & topped with feta   o Kebabs: skewers of chicken, tomatoes, onions, zucchini, squash  o [place]key burgers: made with red onions, mint, dill, lemon juice, feta cheese topped with roasted red peppers . Vegetarian o Cucumber salad: cucumbers, artichoke hearts, celery, red onion, feta cheese, tossed in olive oil & lemon juice  o Hummus and whole grain pita points with a greek salad (lettuce, tomato, feta, olives, cucumbers, red onion) o Lentil soup with celery, carrots made with vegetable broth, garlic, salt and pepper  o Tabouli salad: parsley, bulgur, mint, scallions, cucumbers, tomato, radishes, lemon juice, olive oil, salt and pepper.

## 2018-06-16 NOTE — Assessment & Plan Note (Signed)
No changes in current management, will follow labs done today and will give further recommendations accordingly.  

## 2018-06-16 NOTE — Assessment & Plan Note (Signed)
BP adequately controlled. Continue low-salt diet. No changes in amlodipine 10 mg daily, we discussed some side effects. Continue monitoring BP regularly. Annual eye exam. I think it is appropriate to continue following annually, before if needed.

## 2018-06-19 MED ORDER — AMLODIPINE BESYLATE 10 MG PO TABS: ORAL_TABLET | ORAL | 3 refills | Status: DC

## 2018-07-09 ENCOUNTER — Ambulatory Visit
Admission: RE | Admit: 2018-07-09 | Discharge: 2018-07-09 | Disposition: A | Discharge status: Home / Self Care | Payer: 59 | Source: Ambulatory Visit | Attending: Family Medicine | Admitting: Family Medicine

## 2018-07-09 DIAGNOSIS — Z1231 Encounter for screening mammogram for malignant neoplasm of breast: Secondary | ICD-10-CM | POA: Diagnosis not present

## 2018-09-16 ENCOUNTER — Telehealth: Payer: Self-pay | Admitting: Family Medicine

## 2018-09-16 NOTE — Telephone Encounter (Signed)
Copied from CRM [id number]. Topic: Quick Communication - Rx Refill/Question >> Sep 16, 2018  9:51 AM [name] wrote: Medication: cyclobenzaprine (FLEXERIL) 10 MG tablet [[id number]] amLODipine (NORVASC) 10 MG tablet [[id number]] pravastatin (PRAVACHOL) 40 MG tablet [[id number]]  pt would like 90 day supply but insurance will not approve. Pt would like to know if anything can be done about this. Please advise.   Has the patient contacted their pharmacy?  Preferred Pharmacy (with phone number or street name):[facility] [phone] - [place], [place] - [address] AT [facility] [address] [phone] (Phone) [phone] (Fax)

## 2018-09-29 ENCOUNTER — Other Ambulatory Visit: Payer: Self-pay | Admitting: Family Medicine

## 2018-09-29 DIAGNOSIS — M546 Pain in thoracic spine: Secondary | ICD-10-CM

## 2018-11-19 ENCOUNTER — Other Ambulatory Visit: Payer: Self-pay | Admitting: Family

## 2018-11-22 IMAGING — DX DG RIBS W/ CHEST 3+V*R*
3 series · 3 of 3 positions shown · non-contrast
Comparison: 06/24/2013

CLINICAL DATA: Right-sided chest pain for several months, no known
injury, initial encounter

EXAM:
RIGHT RIBS AND CHEST - 3+ VIEW

[chest pa]
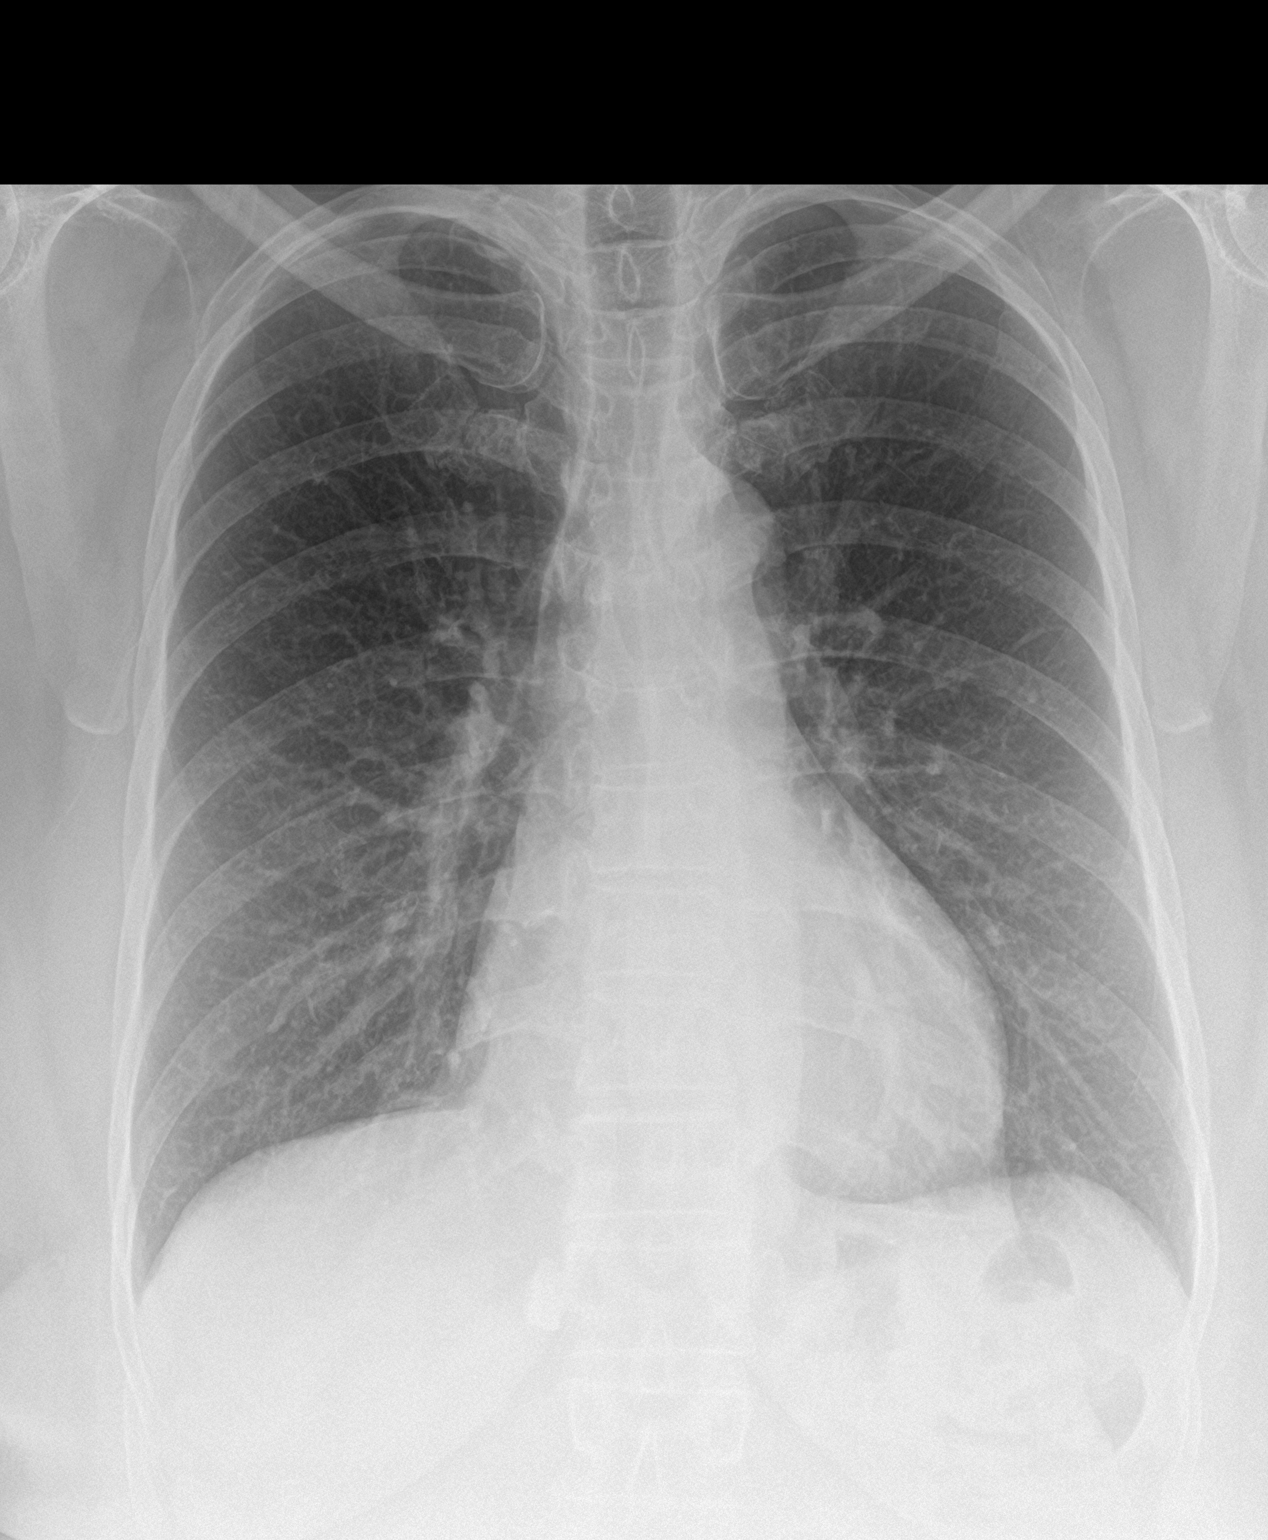

[rib ap]
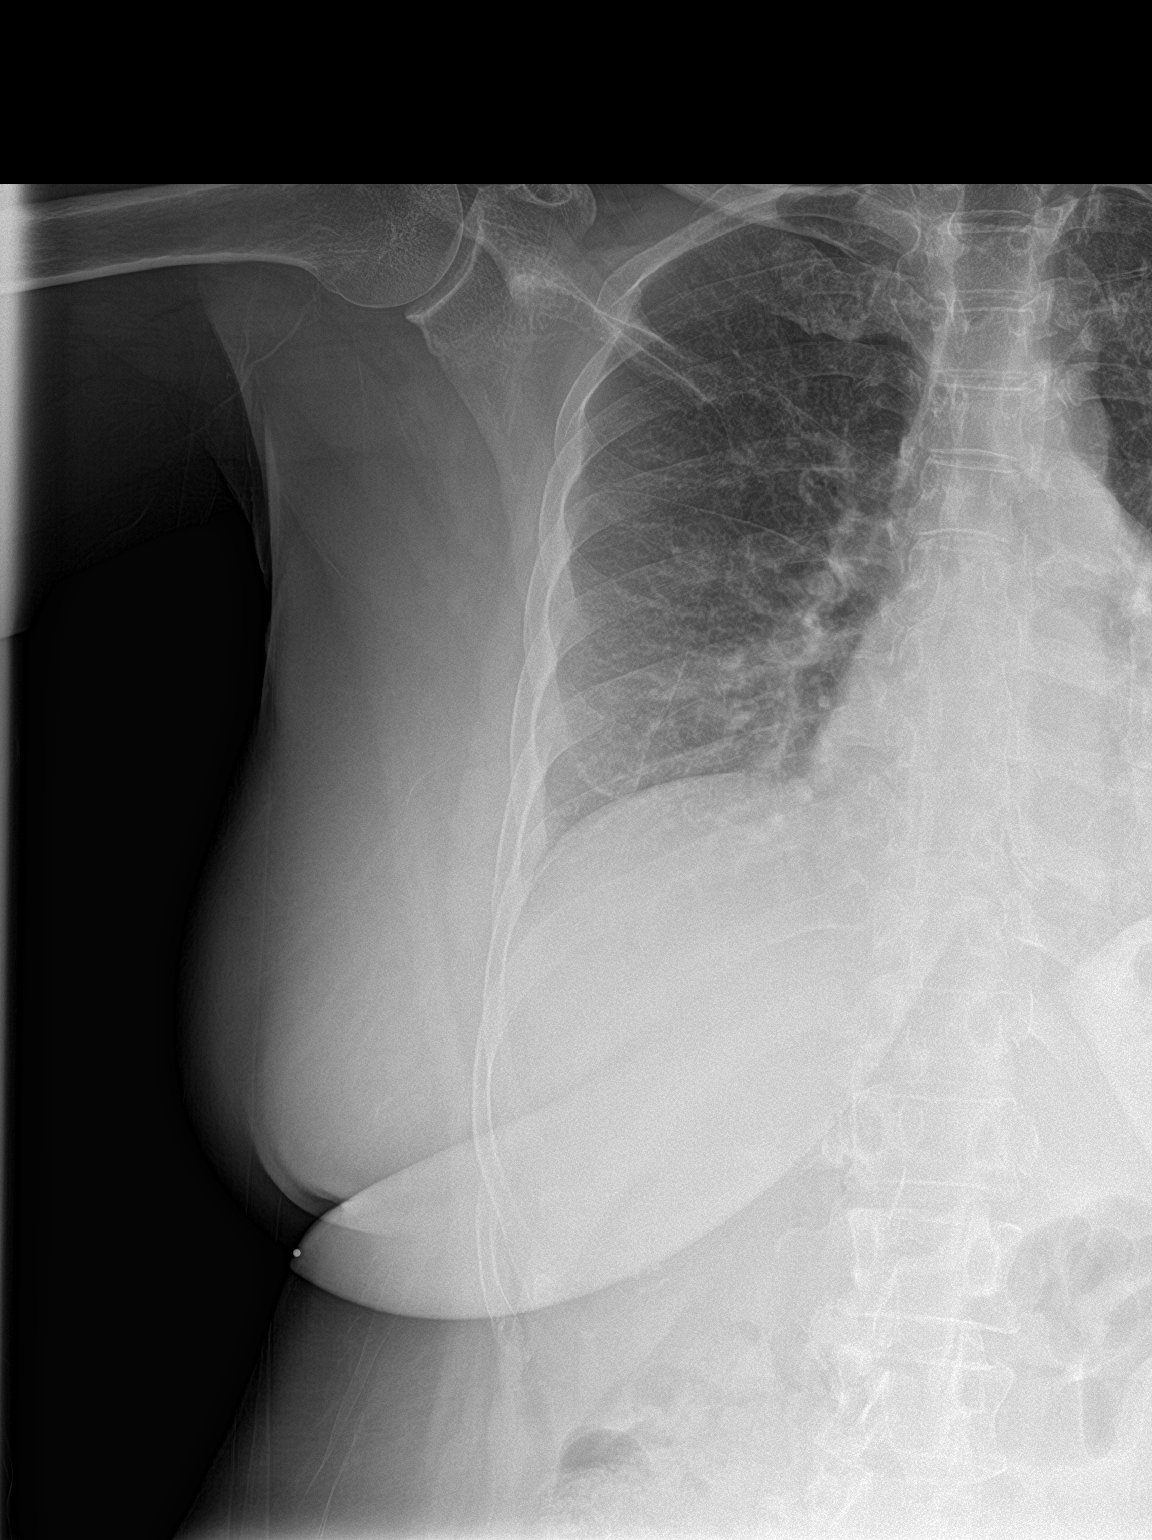

[rib obl]
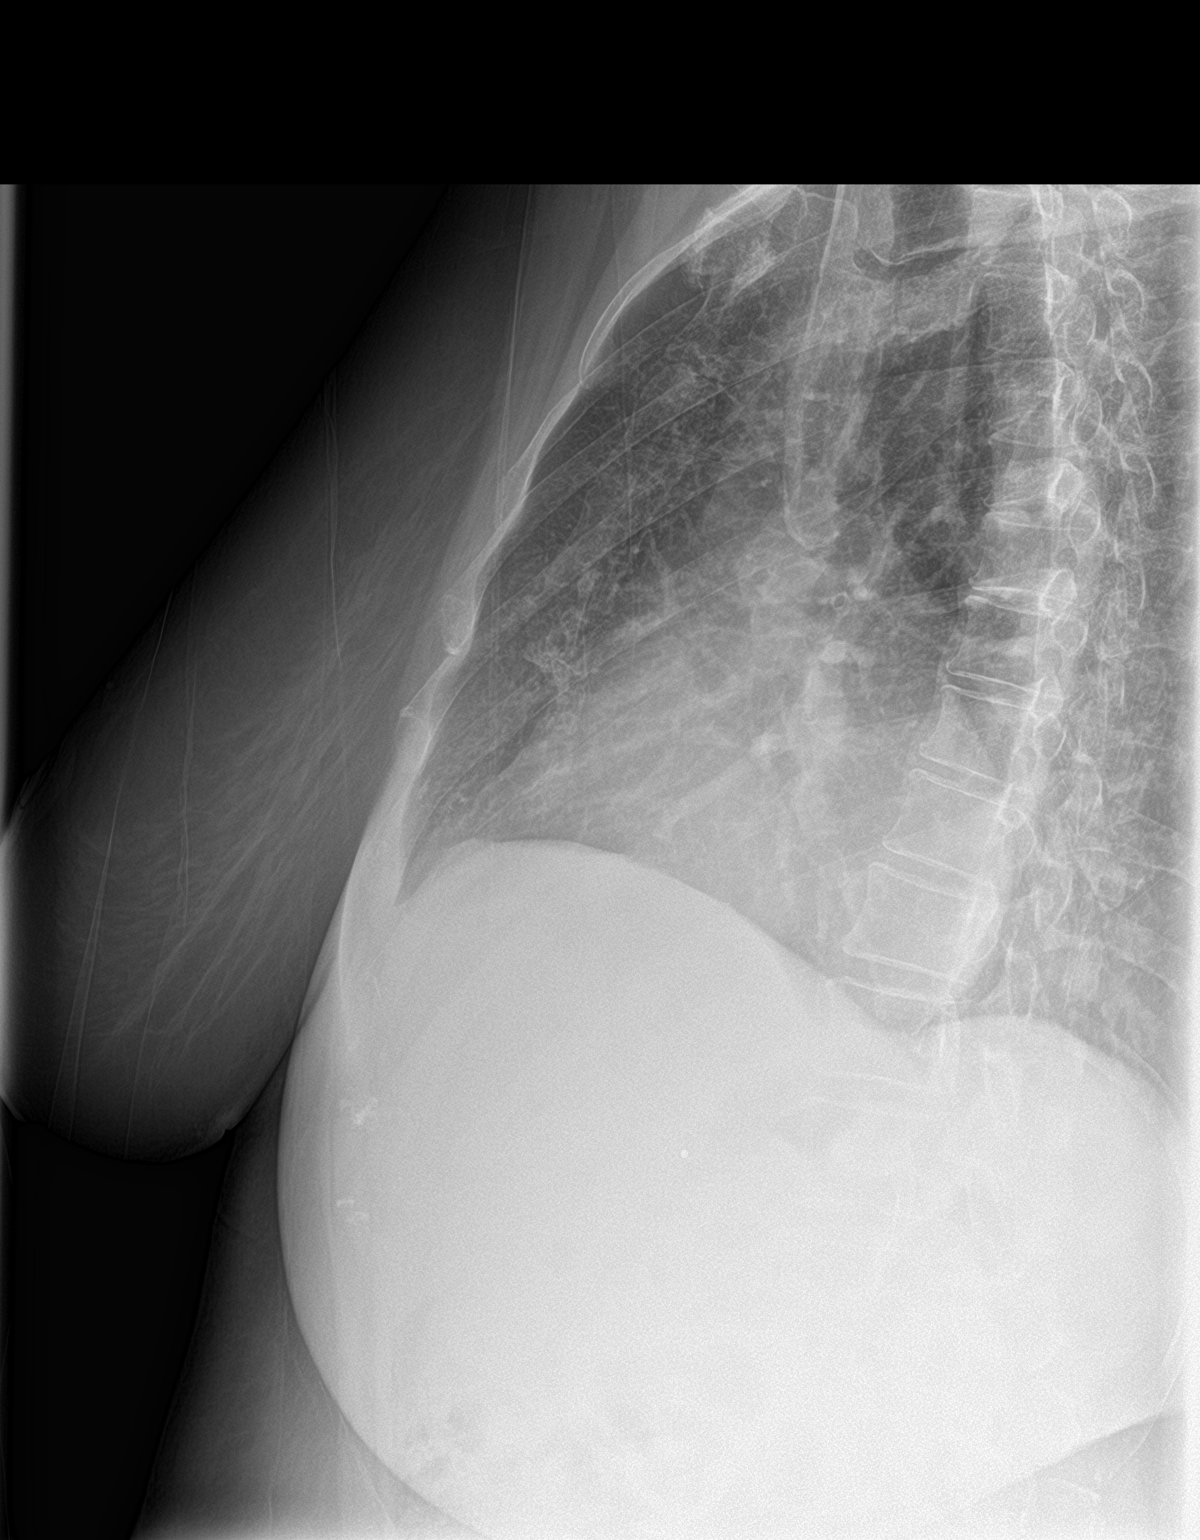

[3 of 3 positions shown; findings below may reference images not displayed]

FINDINGS: Cardiac shadows within normal limits. The lungs are well aerated
bilaterally. No focal infiltrate or sizable effusion is seen. No
pneumothorax is noted. No acute rib abnormality is noted.
IMPRESSION: No acute abnormality seen.

## 2018-12-16 ENCOUNTER — Ambulatory Visit (INDEPENDENT_AMBULATORY_CARE_PROVIDER_SITE_OTHER): Payer: 59 | Admitting: Family Medicine

## 2018-12-16 ENCOUNTER — Other Ambulatory Visit: Payer: Self-pay

## 2018-12-16 ENCOUNTER — Encounter: Payer: Self-pay | Admitting: Family Medicine

## 2018-12-16 DIAGNOSIS — G4700 Insomnia, unspecified: Secondary | ICD-10-CM | POA: Diagnosis not present

## 2018-12-16 DIAGNOSIS — Z634 Disappearance and death of family member: Secondary | ICD-10-CM

## 2018-12-16 DIAGNOSIS — F4320 Adjustment disorder, unspecified: Secondary | ICD-10-CM

## 2018-12-16 MED ORDER — TRAZODONE HCL 50 MG PO TABS: 25.0000 mg | ORAL_TABLET | Freq: Every evening | ORAL | 1 refills | Status: DC | PRN

## 2018-12-16 NOTE — Progress Notes (Signed)
Virtual Visit via Telephone Note  I connected with Rachel Love on 12/20/18 at  8:15 AM EDT by telephone and verified that I am speaking with the correct person using two identifiers.   [email or web address] is down,so video visit was changed to telephone visit.  I discussed the limitations, risks, security and privacy concerns of performing an evaluation and management service by telephone and the availability of in person appointments. I also discussed with the patient that there may be a patient responsible charge related to this service. The patient expressed understanding and agreed to proceed.  Location patient: home Location provider: work office Participants present for the call: patient, provider Patient did not have a visit in the prior 7 days to address this/these issue(s).   History of Present Illness: Rachel Love is a 58 years old female with history of hypertension and hyperlipidemia, who is complaining about difficulty sleeping, requesting medication to help her sleep.  Her husband passed away unexpectedly this past Sunday in a motorcycle accident. "I cannot stop crying", she denies suicidal thoughts. No prior history of depression or anxiety. + Fatigue and decreased appetite.  Currently she is staying with her daughter.   She has been checking BP, and it is "good." Taking Amlodipine 10 mg as instructed.   Observations/Objective: Patient sounds cheerful and well on the phone. I do not appreciate any SOB. Speech and thought processing are grossly intact. Patient reported vitals:N/A  Assessment and Plan: 1. Insomnia, unspecified type - traZODone (DESYREL) 50 MG tablet; Take 0.5-1 tablets (25-50 mg total) by mouth at bedtime as needed for sleep.  Dispense: 30 tablet; Refill: 1  2. Bereavement reaction - traZODone (DESYREL) 50 MG tablet; Take 0.5-1 tablets (25-50 mg total) by mouth at bedtime as needed for sleep.  Dispense: 30 tablet; Refill: 1  We discussed treatment  options, she does not want any controlled medication. After discussion of some side effects, she agrees with trying trazodone 25 to 50 mg at bedtime. Psychotherapy will also help, she is not sure if it is covered by insurance. She states that she does not need a note for work. Instructed about warning signs.   Follow Up Instructions: Follow-up in 4 weeks, before if needed.  I did not refer this patient for an OV in the next 24 hours for this/these issue(s).  I discussed the assessment and treatment plan with the patient. She was provided an opportunity to ask questions and all were answered. The patient agreed with the plan and demonstrated an understanding of the instructions.   The patient was advised to call back or seek an in-person evaluation if the symptoms worsen or if the condition fails to improve as anticipated.  I provided 11 minutes of non-face-to-face time during this encounter.   Ledell Codrington Martinique, MD

## 2019-01-10 ENCOUNTER — Other Ambulatory Visit: Payer: Self-pay | Admitting: Family Medicine

## 2019-01-13 ENCOUNTER — Other Ambulatory Visit: Payer: Self-pay

## 2019-01-13 ENCOUNTER — Ambulatory Visit (INDEPENDENT_AMBULATORY_CARE_PROVIDER_SITE_OTHER): Payer: 59 | Admitting: Family Medicine

## 2019-01-13 ENCOUNTER — Encounter: Payer: Self-pay | Admitting: Family Medicine

## 2019-01-13 DIAGNOSIS — G4700 Insomnia, unspecified: Secondary | ICD-10-CM

## 2019-01-13 DIAGNOSIS — Z20828 Contact with and (suspected) exposure to other viral communicable diseases: Secondary | ICD-10-CM

## 2019-01-13 DIAGNOSIS — F4320 Adjustment disorder, unspecified: Secondary | ICD-10-CM

## 2019-01-13 DIAGNOSIS — Z634 Disappearance and death of family member: Secondary | ICD-10-CM

## 2019-01-13 DIAGNOSIS — Z20822 Contact with and (suspected) exposure to covid-19: Secondary | ICD-10-CM

## 2019-01-13 NOTE — Progress Notes (Signed)
Virtual Visit via Video Note   I connected with Rachel Love on 01/18/19 at  8:00 AM EDT by a video enabled telemedicine application and verified that I am speaking with the correct person using two identifiers.  Location patient: home Location provider:work office Persons participating in the virtual visit: patient, provider  I discussed the limitations of evaluation and management by telemedicine and the availability of in person appointments. The patient expressed understanding and agreed to proceed.   HPI: Rachel Love is a 58 years old female with history of hypertension, hyperlipidemia who is following on her last virtual visit.  Last evaluation done on 12/16/2018, when she was complaining of difficulty sleeping and depressed mood due to the unexpected death of her husband. She was staying with her daughter until recently. Overall she feels better.  Trazodone 50 mg was added, she took medication for 2 weeks and it did help with sleep. Now she is sleeping better and does not need to take medication. Still feeling sad. She denies more frequent crying spells or suicidal thoughts. Negative for suicidal thoughts.  Is also requesting a letter for work. Her daughter tested positive for COVID-19, so she moved to a hotel. Her COVID-19 testing result is pending. She denies fever, chills, sore throat, cough, dyspnea, chest pain, GI symptoms, or skin rash. Negative for anosmia and ageusia.  ROS: See pertinent positives and negatives per HPI.  Past Medical History:  Diagnosis Date  . Chicken pox   . Hyperlipidemia   . Hypertension     Past Surgical History:  Procedure Laterality Date  . UTERINE FIBROID SURGERY  2003    Family History  Problem Relation Age of Onset  . Hypertension Mother   . Cancer Father        lung  . Heart disease Maternal Uncle        CAD  . Breast cancer Neg Hx     Social History   Socioeconomic History  . Marital status: Married    Spouse name:  Not on file  . Number of children: Not on file  . Years of education: Not on file  . Highest education level: Not on file  Occupational History  . Not on file  Social Needs  . Financial resource strain: Not on file  . Food insecurity    Worry: Not on file    Inability: Not on file  . Transportation needs    Medical: Not on file    Non-medical: Not on file  Tobacco Use  . Smoking status: Never Smoker  . Smokeless tobacco: Never Used  Substance and Sexual Activity  . Alcohol use: No  . Drug use: No  . Sexual activity: Yes  Lifestyle  . Physical activity    Days per week: Not on file    Minutes per session: Not on file  . Stress: Not on file  Relationships  . Social Herbalist on phone: Not on file    Gets together: Not on file    Attends religious service: Not on file    Active member of club or organization: Not on file    Attends meetings of clubs or organizations: Not on file    Relationship status: Not on file  . Intimate partner violence    Fear of current or ex partner: Not on file    Emotionally abused: Not on file    Physically abused: Not on file    Forced sexual activity: Not on file  Other Topics Concern  . Not on file  Social History Narrative  . Not on file      Current Outpatient Medications:  .  amLODipine (NORVASC) 10 MG tablet, TAKE 1 TABLET(10 MG) BY MOUTH DAILY, Disp: 90 tablet, Rfl: 3 .  fluticasone (FLONASE) 50 MCG/ACT nasal spray, Place 1 spray into both nostrils 2 (two) times daily. (Patient taking differently: Place 1 spray into both nostrils as needed. ), Disp: 16 g, Rfl: 3 .  pravastatin (PRAVACHOL) 40 MG tablet, TAKE 1 TABLET(40 MG) BY MOUTH DAILY WITH SUPPER, Disp: 90 tablet, Rfl: 3 .  traZODone (DESYREL) 50 MG tablet, Take 0.5-1 tablets (25-50 mg total) by mouth at bedtime as needed for sleep., Disp: 30 tablet, Rfl: 1  EXAM:  VITALS per patient if applicable:N/A  GENERAL: alert, oriented, appears well and in no acute  distress  HEENT: atraumatic,normocephalic,conjunctiva clear, no obvious facial abnormalities on inspection.  LUNGS: on inspection no signs of respiratory distress, breathing rate appears normal, no obvious gross SOB, gasping or wheezing  CV: no obvious cyanosis  PSYCH/NEURO: pleasant and cooperative, no obvious depression or anxiety, speech and thought processing grossly intact  ASSESSMENT AND PLAN:  Discussed the following assessment and plan:  Insomnia, unspecified type  Continue good sleep hygiene. If needed OTC sleeping aids can be taken, Melatonin 3-5 mg at bedtime. Instructed about warning signs. Follow-up as needed.  Bereavement reaction - Plan: She is feeling better. She does not think medication for anxiety or depression is needed at this time. Psychotherapy to be considered if symptoms get worse. Instructed about warning signs.  Close Exposure to Covid-19 Virus - Plan: Asymptomatic. Pending COVID 19 results. Social distancing/quarentine x 14 since date of exposure.   Work excuse letter will be provided to cover from January 05, 2019 and to call back January 19, 2019.   I discussed the assessment and treatment plan with the patient. She was provided an opportunity to ask questions and all were answered. She agreed with the plan and demonstrated an understanding of the instructions.    Return if symptoms worsen or fail to improve, for Keep f/u visit.    Betty SwazilandJordan, MD

## 2019-01-18 ENCOUNTER — Encounter: Payer: Self-pay | Admitting: Family Medicine

## 2019-01-21 ENCOUNTER — Telehealth: Payer: Self-pay | Admitting: Family Medicine

## 2019-01-21 NOTE — Telephone Encounter (Signed)
Please ask Rachel Love to send a copy of testing results. Daughter was positive for COVID 19. Continue quarantine,complete 14 days since date daughter started with symptoms, I think it is same date she moved to a hotel. Check temp daily and monitor for respiratory symptoms. If she continues being asymptomatic and no fever I think it is appropriate to go back to work (after completing quarantine),wearing mask and continuing social distancing.  Thanks, BJ

## 2019-01-21 NOTE — Telephone Encounter (Signed)
COVID test results are not in Epic, not sure where pt was tested.

## 2019-01-21 NOTE — Telephone Encounter (Signed)
Message sent to Dr. Jordan for review. 

## 2019-01-21 NOTE — Telephone Encounter (Signed)
Relation to pt: self  Call back number: [phone]    Reason for call:  Patient states she was tested positive for COVID and viewed results on the [facility] website yesterday. Patient requesting return back to work Dr. Note return date  Tuesday 01/27/2019, patient in need of clinical advice. Patient currently has no symptoms, please advise. Patient is working on getting PCP the results, fax # [phone].

## 2019-01-21 NOTE — Telephone Encounter (Signed)
Spoke with patient and gave recommendations per Dr. Martinique. Patient verbalized understanding. Patient stated that she will try to contact [facility] and [facility] to see if they will fax results to Dr. Martinique. Patient would like a return to work note for Thursday 01/29/2019 sent to her home address.

## 2019-01-27 ENCOUNTER — Telehealth: Payer: Self-pay | Admitting: Family Medicine

## 2019-01-27 ENCOUNTER — Telehealth: Payer: Self-pay | Admitting: *Deleted

## 2019-01-27 ENCOUNTER — Encounter: Payer: Self-pay | Admitting: *Deleted

## 2019-01-27 NOTE — Telephone Encounter (Signed)
disregard

## 2019-01-27 NOTE — Telephone Encounter (Signed)
It is Ok to provide letter as requested. Thanks, BJ

## 2019-01-27 NOTE — Telephone Encounter (Signed)
Pt called and stated that she received work note in the mail and this note stated that she should return to work on the 20th. Pt states that the note should state the 29th. Pt would like to come pick this up today. She will return to work tomorrow. Please advise

## 2019-01-27 NOTE — Telephone Encounter (Signed)
It is ok to provide letter as requested. Thanks, BJ 

## 2019-01-27 NOTE — Telephone Encounter (Signed)
Copied from [place] [id number]. Topic: General - Other >> Jan 26, 2019  5:30 PM Yvette Rack wrote: Reason for CRM: Pt stated the doctor note that was sent to her employer was wrong. Pt stated the note needs to provide the correct date from 01/05/19 to 01/22/19 as the time frame that she was out of work due to quarantine and the positive covid-19 test results. Pt stated she needs to pick up the note before 01/28/19 when she returns back to work.  Dr. Martinique please advise

## 2019-01-27 NOTE — Telephone Encounter (Signed)
Letter completed and sent through [place]

## 2019-01-28 NOTE — Telephone Encounter (Signed)
This has been taking care of. Called pt ot verify she was able to print off work note and she was able to. No further action needed!

## 2019-02-21 ENCOUNTER — Other Ambulatory Visit: Payer: Self-pay | Admitting: Family Medicine

## 2019-02-21 DIAGNOSIS — Z634 Disappearance and death of family member: Secondary | ICD-10-CM

## 2019-02-21 DIAGNOSIS — G4700 Insomnia, unspecified: Secondary | ICD-10-CM

## 2019-02-21 DIAGNOSIS — F4320 Adjustment disorder, unspecified: Secondary | ICD-10-CM

## 2019-03-13 ENCOUNTER — Other Ambulatory Visit: Payer: Self-pay

## 2019-03-13 ENCOUNTER — Telehealth (INDEPENDENT_AMBULATORY_CARE_PROVIDER_SITE_OTHER): Payer: 59 | Admitting: Family Medicine

## 2019-03-13 DIAGNOSIS — S29011A Strain of muscle and tendon of front wall of thorax, initial encounter: Secondary | ICD-10-CM

## 2019-03-13 DIAGNOSIS — M7918 Myalgia, other site: Secondary | ICD-10-CM | POA: Diagnosis not present

## 2019-03-13 NOTE — Progress Notes (Signed)
Virtual Visit via Video Note  I connected with Rachel Love on 03/13/19 at  2:00 PM EDT by a video enabled telemedicine application 2/2 [id number] pandemic and verified that I am speaking with the correct person using two identifiers.  Location patient: home Location provider:work or home office Persons participating in the virtual visit: patient, provider  I discussed the limitations of evaluation and management by telemedicine and the availability of in person appointments. The patient expressed understanding and agreed to proceed.   HPI: Pt feel down 4 steps on Sunday hitting the top of her butt, back , and head.  Having some discomfort on L lower rib cage under breast. Notices a soreness/sharp pain with twisting movements and laying down on L side.  Feeling is a <5/10 pain.  Pt denies edema, deformity, SOB, fever, chills, cough.  Pt able to take in a deep breath without difficulty.  Pt has not tried anything for her symptoms.  Pt has been able to go to work without issue.  Plans on helping with registration at a golf tournament this wknd.  ROS: See pertinent positives and negatives per HPI.  Past Medical History:  Diagnosis Date  . Chicken pox   . Hyperlipidemia   . Hypertension     Past Surgical History:  Procedure Laterality Date  . UTERINE FIBROID SURGERY  2003    Family History  Problem Relation Age of Onset  . Hypertension Mother   . Cancer Father        lung  . Heart disease Maternal Uncle        CAD  . Breast cancer Neg Hx     Current Outpatient Medications:  .  amLODipine (NORVASC) 10 MG tablet, TAKE 1 TABLET(10 MG) BY MOUTH DAILY, Disp: 90 tablet, Rfl: 3 .  fluticasone (FLONASE) 50 MCG/ACT nasal spray, Place 1 spray into both nostrils 2 (two) times daily. (Patient taking differently: Place 1 spray into both nostrils as needed. ), Disp: 16 g, Rfl: 3 .  pravastatin (PRAVACHOL) 40 MG tablet, TAKE 1 TABLET(40 MG) BY MOUTH DAILY WITH SUPPER, Disp: 90 tablet, Rfl:  3 .  traZODone (DESYREL) 50 MG tablet, Take 0.5-1 tablets (25-50 mg total) by mouth at bedtime as needed for sleep., Disp: 30 tablet, Rfl: 1  EXAM:  VITALS per patient if applicable:  GENERAL: alert, oriented, appears well and in no acute distress  HEENT: atraumatic, conjunctiva clear, no obvious abnormalities on inspection of external nose and ears  NECK: normal movements of the head and neck  LUNGS: on inspection no signs of respiratory distress, breathing rate appears normal, no obvious gross SOB, gasping or wheezing  CV: no obvious cyanosis  MS: moves all visible extremities without noticeable abnormality.  TTP of L lower rib cage underneath breast., no deformities, edema, or erythema noted.  PSYCH/NEURO: pleasant and cooperative, no obvious depression or anxiety, speech and thought processing grossly intact  ASSESSMENT AND PLAN:  Discussed the following assessment and plan:  Intercostal muscle pain -likely 2/2 msk strain.  Consider rib contusion though no edema noted.  Rib fx also to be consdiered though less likely. -supportive care: NSAIDs, heat -CXR not indicated at this time.  Discussed with pt who is ok with this decision. -advised to take deep breaths in to prevent atelectasis/pneumonia. -given precautions.  For worsened symptoms consider imaging.  Intercostal muscle strain, initial encounter  F/u prn   I discussed the assessment and treatment plan with the patient. The patient was provided an opportunity to ask  questions and all were answered. The patient agreed with the plan and demonstrated an understanding of the instructions.   The patient was advised to call back or seek an in-person evaluation if the symptoms worsen or if the condition fails to improve as anticipated.   Billie Ruddy, MD

## 2019-04-23 ENCOUNTER — Other Ambulatory Visit: Payer: Self-pay

## 2019-04-23 DIAGNOSIS — Z20822 Contact with and (suspected) exposure to covid-19: Secondary | ICD-10-CM

## 2019-04-25 LAB — NOVEL CORONAVIRUS, NAA: SARS-CoV-2, NAA: NOT DETECTED

## 2019-06-12 ENCOUNTER — Telehealth: Payer: Self-pay | Admitting: Family Medicine

## 2019-06-12 NOTE — Telephone Encounter (Signed)
LMVM for the patient to reschedule their appointment due to the provider having a meeting that morning.

## 2019-06-19 ENCOUNTER — Encounter: Payer: 59 | Admitting: Family Medicine

## 2019-06-22 ENCOUNTER — Ambulatory Visit (INDEPENDENT_AMBULATORY_CARE_PROVIDER_SITE_OTHER): Payer: 59 | Admitting: Family Medicine

## 2019-06-22 ENCOUNTER — Encounter: Payer: Self-pay | Admitting: Family Medicine

## 2019-06-22 ENCOUNTER — Other Ambulatory Visit: Payer: Self-pay

## 2019-06-22 VITALS — BP 126/80 | HR 99 | Temp 95.2°F | Resp 12 | Ht 66.0 in | Wt 217.0 lb

## 2019-06-22 DIAGNOSIS — I10 Essential (primary) hypertension: Secondary | ICD-10-CM

## 2019-06-22 DIAGNOSIS — E559 Vitamin D deficiency, unspecified: Secondary | ICD-10-CM | POA: Diagnosis not present

## 2019-06-22 DIAGNOSIS — E785 Hyperlipidemia, unspecified: Secondary | ICD-10-CM

## 2019-06-22 DIAGNOSIS — Z0000 Encounter for general adult medical examination without abnormal findings: Secondary | ICD-10-CM | POA: Diagnosis not present

## 2019-06-22 DIAGNOSIS — Z130 Encounter for screening for diseases of the blood and blood-forming organs and certain disorders involving the immune mechanism: Secondary | ICD-10-CM

## 2019-06-22 DIAGNOSIS — R208 Other disturbances of skin sensation: Secondary | ICD-10-CM

## 2019-06-22 DIAGNOSIS — Z1211 Encounter for screening for malignant neoplasm of colon: Secondary | ICD-10-CM | POA: Diagnosis not present

## 2019-06-22 DIAGNOSIS — G4700 Insomnia, unspecified: Secondary | ICD-10-CM

## 2019-06-22 DIAGNOSIS — Z23 Encounter for immunization: Secondary | ICD-10-CM | POA: Diagnosis not present

## 2019-06-22 DIAGNOSIS — Z13228 Encounter for screening for other metabolic disorders: Secondary | ICD-10-CM

## 2019-06-22 DIAGNOSIS — R200 Anesthesia of skin: Secondary | ICD-10-CM

## 2019-06-22 DIAGNOSIS — Z1329 Encounter for screening for other suspected endocrine disorder: Secondary | ICD-10-CM

## 2019-06-22 MED ORDER — TRAZODONE HCL 50 MG PO TABS: 25.0000 mg | ORAL_TABLET | Freq: Every evening | ORAL | 2 refills | Status: DC | PRN

## 2019-06-22 NOTE — Patient Instructions (Addendum)
Today you have you routine preventive visit.  A few things to remember from today's visit:   Routine general medical examination at a health care facility  Hyperlipidemia, unspecified hyperlipidemia type - Plan: Lipid panel  Vitamin D deficiency - Plan: VITAMIN D 25 Hydroxy (Vit-D Deficiency, Fractures)  Hypertension, essential, benign  Colon cancer screening - Plan: Ambulatory referral to Gastroenterology  Screening for endocrine, metabolic and immunity disorder - Plan: Hemoglobin A1c, Comprehensive metabolic panel  Numbness of left foot - Plan: Vitamin B12, CBC, TSH  Insomnia, unspecified type - Plan: traZODone (DESYREL) 50 MG tablet  Bereavement reaction - Plan: traZODone (DESYREL) 50 MG tablet   Please be sure medication list is accurate. If a new problem present, please set up appointment sooner than planned today.  At least 150 minutes of moderate exercise per week, daily brisk walking for 15-30 min is a good exercise option. Healthy diet low in saturated (animal) fats and sweets and consisting of fresh fruits and vegetables, lean meats such as fish and white chicken and whole grains.  These are some of recommendations for screening depending of age and risk factors:   - Vaccines:  Tdap vaccine every 10 years.  Shingles vaccine recommended at age 11, could be given after 58 years of age but not sure about insurance coverage.   Pneumonia vaccines:  Prevnar 13 at 65 and Pneumovax at 1. Sometimes Pneumovax is giving earlier if history of smoking, lung disease,diabetes,kidney disease among some.    Screening for diabetes at age 88 and every 3 years.  Cervical cancer prevention:  Pap smear starts at 58 years of age and continues periodically until 58 years old in low risk women. Pap smear every 3 years between 72 and 1 years old. Pap smear every 3-5 years between women 56 and older if pap smear negative and HPV screening negative.   -Breast cancer: Mammogram:  There is disagreement between experts about when to start screening in low risk asymptomatic female but recent recommendations are to start screening at 12 and not later than 58 years old , every 1-2 years and after 58 yo q 2 years. Screening is recommended until 58 years old but some women can continue screening depending of healthy issues.   Colon cancer screening: starts at 57 years old until 58 years old.  Also recommended:  1. Dental visit- Brush and floss your teeth twice daily; visit your dentist twice a year. 2. Eye doctor- Get an eye exam at least every 2 years. 3. Helmet use- Always wear a helmet when riding a bicycle, motorcycle, rollerblading or skateboarding. 4. Safe sex- If you may be exposed to sexually transmitted infections, use a condom. 5. Seat belts- Seat belts can save your live; always wear one. 6. Smoke/Carbon Monoxide detectors- These detectors need to be installed on the appropriate level of your home. Replace batteries at least once a year. 7. Skin cancer- When out in the sun please cover up and use sunscreen 15 SPF or higher. 8. Violence- If anyone is threatening or hurting you, please tell your healthcare provider.  9. Drink alcohol in moderation- Limit alcohol intake to one drink or less per day. Never drink and drive.

## 2019-06-22 NOTE — Progress Notes (Signed)
HPI:   Rachel Love is a 58 y.o. female, who is here today for her routine physical.  Last CPE: 06/16/18  Regular exercise 3 or more time per week: Not consistently, she just got a stationary bike. Following a healthy diet: She tries to do so, her daughter usually cooks for her, so her deit could be better. She lives with her daughter.  Chronic medical problems: HTN,HLD, allergies,and vit D deficiency among some.  Pap smear 05/27/2017 negative for malignancy and HPV not detected.* Hx of abnormal pap smears: Negative.  Immunization History  Administered Date(s) Administered  . Influenza,inj,Quad PF,6+ Mos 05/27/2017, 06/16/2018    Mammogram: 07/09/18 [facility] 1 Colonoscopy: At age 35. She does not remember results. DEXA: N/A  Hep C screening: 05/2017.  Chronic disease management:  HTN: Currently she is on Amlodipine 10 mg daily. Negative for severe/frequent headache, chest pain, dyspnea, focal weakness, or edema.  She is not checking BP at home.  Component     Latest Ref Rng & Units 12/03/2017  Sodium     135 - 145 mEq/L 142  Potassium     3.5 - 5.1 mEq/L 3.9  Chloride     96 - 112 mEq/L 107  CO2     19 - 32 mEq/L 26  Glucose     70 - 99 mg/dL 90  BUN     6 - 23 mg/dL 17  Creatinine     1.61 - 1.20 mg/dL 0.96  Total Bilirubin     0.2 - 1.2 mg/dL 0.5  Alkaline Phosphatase     39 - 117 U/L 97  AST     0 - 37 U/L 12  ALT     0 - 35 U/L 11  Total Protein     6.0 - 8.3 g/dL 6.7  Albumin     3.5 - 5.2 g/dL 3.8  Calcium     8.4 - 10.5 mg/dL 9.2  GFR     >04.54 mL/min 127.80    HLD: She is on Pravastatin 40 mg daily. Tolerating medication well.  Lab Results  Component Value Date   CHOL 181 06/16/2018   HDL 47.20 06/16/2018   LDLCALC 126 (H) 06/16/2018   TRIG 39.0 06/16/2018   CHOLHDL 4 06/16/2018   Left foot numbness,intermittent. Initially in great toe and now plantar. Non radiated back pain and RLE pain, the former one is "not  bad."  Lateral aspect of RLE, from buttock to ankle. Pain is like "toothache." Exacerbated by sleeping on right side. She has applied Biofreeze. She has not noted local edema or erythema.  Vit D deficiency: She is not on vit D supplementation. 25 OH vit D 2 years ago was 35.6.  She is requesting refill of Trazodone.  This medication was started after the death of her husband to help with anxiety and sleep. Discontinued because she did not feel like she longer needed it. Symptoms have re-occurred , she would like to continue Trazodone. She tolerated medication well. No suicidal thoughts.  Review of Systems  Constitutional: Positive for fatigue. Negative for appetite change and fever.  HENT: Negative for dental problem, hearing loss, mouth sores, sore throat, trouble swallowing and voice change.   Eyes: Negative for redness and visual disturbance.  Respiratory: Negative for cough, shortness of breath and wheezing.   Cardiovascular: Negative for leg swelling.  Gastrointestinal: Negative for abdominal pain, nausea and vomiting.       No changes in bowel habits.  Endocrine: Negative for cold intolerance, heat intolerance, polydipsia, polyphagia and polyuria.  Genitourinary: Negative for decreased urine volume, dysuria, hematuria, vaginal bleeding and vaginal discharge.  Musculoskeletal: Negative for gait problem and joint swelling.  Skin: Negative for color change and rash.  Allergic/Immunologic: Positive for environmental allergies.  Neurological: Negative for syncope, facial asymmetry and weakness.  Hematological: Negative for adenopathy. Does not bruise/bleed easily.  Psychiatric/Behavioral: Positive for sleep disturbance. Negative for confusion and hallucinations.  All other systems reviewed and are negative.   Current Outpatient Medications on File Prior to Visit  Medication Sig Dispense Refill  . amLODipine (NORVASC) 10 MG tablet TAKE 1 TABLET(10 MG) BY MOUTH DAILY 90 tablet 3   . fluticasone (FLONASE) 50 MCG/ACT nasal spray Place 1 spray into both nostrils 2 (two) times daily. (Patient taking differently: Place 1 spray into both nostrils as needed. ) 16 g 3  . pravastatin (PRAVACHOL) 40 MG tablet TAKE 1 TABLET(40 MG) BY MOUTH DAILY WITH SUPPER 90 tablet 3   No current facility-administered medications on file prior to visit.     Past Medical History:  Diagnosis Date  . Chicken pox   . Hyperlipidemia   . Hypertension     Past Surgical History:  Procedure Laterality Date  . UTERINE FIBROID SURGERY  2003    Allergies  Allergen Reactions  . Other     Family History  Problem Relation Age of Onset  . Hypertension Mother   . Cancer Father        lung  . Heart disease Maternal Uncle        CAD  . Breast cancer Neg Hx     Social History   Socioeconomic History  . Marital status: Married    Spouse name: Not on file  . Number of children: Not on file  . Years of education: Not on file  . Highest education level: Not on file  Occupational History  . Not on file  Tobacco Use  . Smoking status: Never Smoker  . Smokeless tobacco: Never Used  Substance and Sexual Activity  . Alcohol use: No  . Drug use: No  . Sexual activity: Yes  Other Topics Concern  . Not on file  Social History Narrative  . Not on file   Social Determinants of Health   Financial Resource Strain:   . Difficulty of Paying Living Expenses: Not on file  Food Insecurity:   . Worried About Programme researcher, broadcasting/film/video in the Last Year: Not on file  . Ran Out of Food in the Last Year: Not on file  Transportation Needs:   . Lack of Transportation (Medical): Not on file  . Lack of Transportation (Non-Medical): Not on file  Physical Activity:   . Days of Exercise per Week: Not on file  . Minutes of Exercise per Session: Not on file  Stress:   . Feeling of Stress : Not on file  Social Connections:   . Frequency of Communication with Friends and Family: Not on file  . Frequency of  Social Gatherings with Friends and Family: Not on file  . Attends Religious Services: Not on file  . Active Member of Clubs or Organizations: Not on file  . Attends Banker Meetings: Not on file  . Marital Status: Not on file     Vitals:   06/22/19 0756  BP: 126/80  Pulse: 99  Resp: 12  Temp: (!) 95.2 F (35.1 C)  SpO2: 99%   Body mass index is  35.02 kg/m.   Wt Readings from Last 3 Encounters:  06/22/19 217 lb (98.4 kg)  06/16/18 219 lb (99.3 kg)  01/27/18 216 lb 4 oz (98.1 kg)    Physical Exam  Nursing note and vitals reviewed. Constitutional: She is oriented to person, place, and time. She appears well-developed. No distress.  HENT:  Head: Normocephalic and atraumatic.  Right Ear: Hearing, tympanic membrane, external ear and ear canal normal.  Left Ear: Hearing, tympanic membrane, external ear and ear canal normal.  Mouth/Throat: Uvula is midline, oropharynx is clear and moist and mucous membranes are normal.  Eyes: Pupils are equal, round, and reactive to light. Conjunctivae and EOM are normal.  Neck: No tracheal deviation present. No thyromegaly present.  Cardiovascular: Normal rate and regular rhythm.  No murmur heard. Pulses:      Dorsalis pedis pulses are 2+ on the right side and 2+ on the left side.  Respiratory: Effort normal and breath sounds normal. No respiratory distress.  GI: Soft. She exhibits no mass. There is no hepatomegaly. There is no abdominal tenderness.  Genitourinary:    Genitourinary Comments: Breast: No masses,skin changes,or nipple discharge.   Musculoskeletal:        General: No edema.     Lumbar back: No tenderness or bony tenderness.     Right hip: Tenderness present. Decreased range of motion (Mild, flexion and rotation.). Normal strength.       Legs:     Comments: No signs of synovitis appreciated. Tenderness upon palpation around right major trochanteric bursa and buttock.  Lymphadenopathy:    She has no cervical  adenopathy.    She has no axillary adenopathy.       Right: No supraclavicular adenopathy present.       Left: No supraclavicular adenopathy present.  Neurological: She is alert and oriented to person, place, and time. She has normal strength. No cranial nerve deficit. Coordination and gait normal.  Reflex Scores:      Bicep reflexes are 2+ on the right side and 2+ on the left side.      Patellar reflexes are 2+ on the right side and 2+ on the left side. Mildly decreased sensation great toes, L>R  Skin: Skin is warm. No rash noted. No erythema.  Psychiatric: She has a normal mood and affect. Cognition and memory are normal.  Well groomed, good eye contact.    ASSESSMENT AND PLAN:  Ms. Jamesetta GeraldsLorna Lennon Maret was here today annual physical examination.  Orders Placed This Encounter  Procedures  . Tdap vaccine greater than or equal to 7yo IM  . Flu Vaccine QUAD 6+ mos PF IM (Fluarix Quad PF)  . VITAMIN D 25 Hydroxy (Vit-D Deficiency, Fractures)  . Vitamin B12  . TSH  . Lipid panel  . Hemoglobin A1c  . Comprehensive metabolic panel  . CBC  . Ambulatory referral to Gastroenterology    Routine general medical examination at a health care facility We discussed the importance of regular physical activity and healthy diet for prevention of chronic illness and/or complications. Preventive guidelines reviewed. Vaccination updated.  Next CPE in a year.  Hyperlipidemia, unspecified hyperlipidemia typegive  No changes in current management, will follow FLP and will further recommendations accordingly.  Vitamin D deficiency Recommendations in regard to vit D supplementation will be given according to 25 OH vit D results.  Hypertension, essential, benign BP adequately controlled. Recommend monitoring BP regularly. Continue low salt diet. No changes in current management.  Colon cancer screening -  Ambulatory referral to Gastroenterology  Screening for endocrine, metabolic and  immunity disorder -     Hemoglobin A1c; Future -     Comprehensive metabolic panel; Future  Numbness of left foot Neurologic exam today otherwise normal. We discussed possible causes. Appropriate foot care,avoid trauma. Further recommendations will be given according to lab results.  Insomnia, unspecified type Resume Trazodone 25-50 mg at bedtime. Good sleep hygiene is also recommended. F/U in 3 months.  -     traZODone (DESYREL) 50 MG tablet; Take 0.5-1 tablets (25-50 mg total) by mouth at bedtime as needed for sleep.  Need for Tdap vaccination -     Tdap vaccine greater than or equal to 7yo IM  Need for influenza vaccination -     Flu Vaccine QUAD 6+ mos PF IM (Fluarix Quad PF)    Return in 3 months (on 09/20/2019) for Insomnia..   Betty G. Martinique, MD  [facility]. [place] office.

## 2019-06-23 ENCOUNTER — Telehealth: Payer: Self-pay

## 2019-06-23 NOTE — Telephone Encounter (Signed)
Can you get pt scheduled for a lab appt? Her orders are already in, she was in yesterday when we didn't have lab.

## 2019-06-24 ENCOUNTER — Encounter: Payer: Self-pay | Admitting: Family Medicine

## 2019-06-24 NOTE — Telephone Encounter (Signed)
Patient is scheduled for labs on 12/29 at 10:40

## 2019-06-30 ENCOUNTER — Other Ambulatory Visit: Payer: Self-pay

## 2019-06-30 ENCOUNTER — Other Ambulatory Visit (INDEPENDENT_AMBULATORY_CARE_PROVIDER_SITE_OTHER): Payer: 59

## 2019-06-30 DIAGNOSIS — E785 Hyperlipidemia, unspecified: Secondary | ICD-10-CM

## 2019-06-30 DIAGNOSIS — Z1329 Encounter for screening for other suspected endocrine disorder: Secondary | ICD-10-CM

## 2019-06-30 DIAGNOSIS — R208 Other disturbances of skin sensation: Secondary | ICD-10-CM

## 2019-06-30 DIAGNOSIS — Z130 Encounter for screening for diseases of the blood and blood-forming organs and certain disorders involving the immune mechanism: Secondary | ICD-10-CM

## 2019-06-30 DIAGNOSIS — Z13228 Encounter for screening for other metabolic disorders: Secondary | ICD-10-CM | POA: Diagnosis not present

## 2019-06-30 DIAGNOSIS — R200 Anesthesia of skin: Secondary | ICD-10-CM

## 2019-06-30 LAB — CBC
HCT: 37.1 % (ref 36.0–46.0)
Hemoglobin: 11.7 g/dL — ABNORMAL LOW (ref 12.0–15.0)
MCHC: 31.4 g/dL (ref 30.0–36.0)
MCV: 89.7 fl (ref 78.0–100.0)
Platelets: 278 10*3/uL (ref 150.0–400.0)
RBC: 4.14 Mil/uL (ref 3.87–5.11)
RDW: 14.7 % (ref 11.5–15.5)
WBC: 6.8 10*3/uL (ref 4.0–10.5)

## 2019-06-30 LAB — COMPREHENSIVE METABOLIC PANEL
ALT: 13 U/L (ref 0–35)
AST: 25 U/L (ref 0–37)
Albumin: 4 g/dL (ref 3.5–5.2)
Alkaline Phosphatase: 90 U/L (ref 39–117)
BUN: 17 mg/dL (ref 6–23)
CO2: 27 mEq/L (ref 19–32)
Calcium: 9.1 mg/dL (ref 8.4–10.5)
Chloride: 102 mEq/L (ref 96–112)
Creatinine, Ser: 0.66 mg/dL (ref 0.40–1.20)
GFR: 111.25 mL/min (ref >60.00)
Glucose, Bld: 97 mg/dL (ref 70–99)
Potassium: 5.7 mEq/L — ABNORMAL HIGH (ref 3.5–5.1)
Sodium: 135 mEq/L (ref 135–145)
Total Bilirubin: 0.5 mg/dL (ref 0.2–1.2)
Total Protein: 7.2 g/dL (ref 6.0–8.3)

## 2019-06-30 LAB — HEMOGLOBIN A1C: Hgb A1c MFr Bld: 6 % (ref 4.6–6.5)

## 2019-06-30 LAB — LIPID PANEL
Cholesterol: 225 mg/dL — ABNORMAL HIGH (ref 0–200)
HDL: 53.6 mg/dL (ref >39.00)
LDL Cholesterol: 161 mg/dL — ABNORMAL HIGH (ref 0–99)
NonHDL: 171.16
Total CHOL/HDL Ratio: 4
Triglycerides: 52 mg/dL (ref 0.0–149.0)
VLDL: 10.4 mg/dL (ref 0.0–40.0)

## 2019-06-30 LAB — VITAMIN B12: Vitamin B-12: 833 pg/mL (ref 211–911)

## 2019-06-30 LAB — TSH: TSH: 1.46 u[IU]/mL (ref 0.35–4.50)

## 2019-07-06 ENCOUNTER — Other Ambulatory Visit: Payer: Self-pay

## 2019-07-06 DIAGNOSIS — E875 Hyperkalemia: Secondary | ICD-10-CM

## 2019-07-06 MED ORDER — PRAVASTATIN SODIUM 80 MG PO TABS: 80.0000 mg | ORAL_TABLET | Freq: Every day | ORAL | 1 refills | Status: DC

## 2019-07-07 NOTE — Addendum Note (Signed)
Addended by: Weyman Croon E on: 07/07/2019 10:08 AM   Modules accepted: Orders

## 2019-07-13 ENCOUNTER — Other Ambulatory Visit: Payer: Self-pay | Admitting: Family Medicine

## 2019-07-13 DIAGNOSIS — I10 Essential (primary) hypertension: Secondary | ICD-10-CM

## 2019-07-13 DIAGNOSIS — J309 Allergic rhinitis, unspecified: Secondary | ICD-10-CM

## 2019-07-13 MED ORDER — AMLODIPINE BESYLATE 10 MG PO TABS: ORAL_TABLET | ORAL | 3 refills | Status: DC

## 2019-07-13 MED ORDER — FLUTICASONE PROPIONATE 50 MCG/ACT NA SUSP: 1.0000 | Freq: Two times a day (BID) | NASAL | 3 refills | Status: DC

## 2019-07-13 NOTE — Telephone Encounter (Signed)
Medication Refill - Medication: amLODipine (NORVASC) 10 MG tablet   fluticasone (FLONASE) 50 MCG/ACT nasal spray   Has the patient contacted their pharmacy? Yes.   (Agent: If no, request that the patient contact the pharmacy for the refill.) (Agent: If yes, when and what did the pharmacy advise?)  Preferred Pharmacy (with phone number or street name):  [id number] - [place], [place] - [address] AT [facility] [address] [facility] [address] Phone:  [phone]  Fax:  [phone]       Agent: Please be advised that RX refills may take up to 3 business days. We ask that you follow-up with your pharmacy.

## 2019-07-13 NOTE — Telephone Encounter (Signed)
Rx's sent as requested. 

## 2019-07-15 ENCOUNTER — Other Ambulatory Visit: Payer: 59

## 2019-08-07 ENCOUNTER — Encounter: Payer: Self-pay | Admitting: Family Medicine

## 2019-10-30 ENCOUNTER — Other Ambulatory Visit: Payer: Self-pay | Admitting: Family Medicine

## 2019-10-30 DIAGNOSIS — G4700 Insomnia, unspecified: Secondary | ICD-10-CM

## 2020-03-08 ENCOUNTER — Other Ambulatory Visit: Payer: Self-pay | Admitting: Family Medicine

## 2020-05-04 ENCOUNTER — Other Ambulatory Visit: Payer: Self-pay | Admitting: Family Medicine

## 2020-05-04 DIAGNOSIS — G4700 Insomnia, unspecified: Secondary | ICD-10-CM

## 2020-06-16 ENCOUNTER — Other Ambulatory Visit: Payer: Self-pay | Admitting: Family Medicine

## 2020-06-16 DIAGNOSIS — Z1231 Encounter for screening mammogram for malignant neoplasm of breast: Secondary | ICD-10-CM

## 2020-06-22 ENCOUNTER — Ambulatory Visit (INDEPENDENT_AMBULATORY_CARE_PROVIDER_SITE_OTHER): Payer: 59 | Admitting: Family Medicine

## 2020-06-22 ENCOUNTER — Other Ambulatory Visit (HOSPITAL_COMMUNITY)
Admission: RE | Admit: 2020-06-22 | Discharge: 2020-06-22 | Disposition: A | Discharge status: Home / Self Care | Payer: 59 | Source: Ambulatory Visit | Attending: Family Medicine | Admitting: Family Medicine

## 2020-06-22 ENCOUNTER — Other Ambulatory Visit: Payer: Self-pay

## 2020-06-22 ENCOUNTER — Encounter: Payer: Self-pay | Admitting: Family Medicine

## 2020-06-22 VITALS — BP 128/70 | HR 98 | Temp 98.2°F | Resp 16 | Ht 66.0 in | Wt 204.4 lb

## 2020-06-22 DIAGNOSIS — Z124 Encounter for screening for malignant neoplasm of cervix: Secondary | ICD-10-CM | POA: Insufficient documentation

## 2020-06-22 DIAGNOSIS — Z130 Encounter for screening for diseases of the blood and blood-forming organs and certain disorders involving the immune mechanism: Secondary | ICD-10-CM

## 2020-06-22 DIAGNOSIS — E785 Hyperlipidemia, unspecified: Secondary | ICD-10-CM | POA: Diagnosis not present

## 2020-06-22 DIAGNOSIS — Z23 Encounter for immunization: Secondary | ICD-10-CM | POA: Diagnosis not present

## 2020-06-22 DIAGNOSIS — Z780 Asymptomatic menopausal state: Secondary | ICD-10-CM

## 2020-06-22 DIAGNOSIS — Z0000 Encounter for general adult medical examination without abnormal findings: Secondary | ICD-10-CM | POA: Diagnosis not present

## 2020-06-22 DIAGNOSIS — E559 Vitamin D deficiency, unspecified: Secondary | ICD-10-CM

## 2020-06-22 DIAGNOSIS — I10 Essential (primary) hypertension: Secondary | ICD-10-CM

## 2020-06-22 DIAGNOSIS — Z1329 Encounter for screening for other suspected endocrine disorder: Secondary | ICD-10-CM

## 2020-06-22 DIAGNOSIS — Z13228 Encounter for screening for other metabolic disorders: Secondary | ICD-10-CM

## 2020-06-22 DIAGNOSIS — E049 Nontoxic goiter, unspecified: Secondary | ICD-10-CM

## 2020-06-22 DIAGNOSIS — R200 Anesthesia of skin: Secondary | ICD-10-CM

## 2020-06-22 DIAGNOSIS — R202 Paresthesia of skin: Secondary | ICD-10-CM

## 2020-06-22 NOTE — Patient Instructions (Signed)
Today you have you routine preventive visit. A few things to remember from today's visit:   Routine general medical examination at a health care facility  Vitamin D deficiency  Hypertension, essential, benign  Hyperlipidemia, unspecified hyperlipidemia type - Plan: Lipid panel  Numbness and tingling of both feet - Plan: Vitamin B12, TSH, CBC, MR Lumbar Spine Wo Contrast  Asymptomatic postmenopausal estrogen deficiency - Plan: DEXAScan  Screening for endocrine, metabolic and immunity disorder - Plan: COMPLETE METABOLIC PANEL WITH GFR, Hemoglobin A1c  Cervical cancer screening - Plan: PAP [[place]]  Enlarged thyroid gland - Plan: US THYROID  If you need refills please call your pharmacy. Do not use My Chart to request refills or for acute issues that need immediate attention.    Please be sure medication list is accurate. If a new problem present, please set up appointment sooner than planned today.   At least 150 minutes of moderate exercise per week, daily brisk walking for 15-30 min is a good exercise option. Healthy diet low in saturated (animal) fats and sweets and consisting of fresh fruits and vegetables, lean meats such as fish and white chicken and whole grains.  These are some of recommendations for screening depending of age and risk factors:  - Vaccines:  Tdap vaccine every 10 years.  Shingles vaccine recommended at age 74, could be given after 59 years of age but not sure about insurance coverage.   Pneumonia vaccines: Pneumovax at 65. Sometimes Pneumovax is giving earlier if history of smoking, lung disease,diabetes,kidney disease among some.  Screening for diabetes at age 56 and every 3 years.  Cervical cancer prevention:  Pap smear starts at 59 years of age and continues periodically until 59 years old in low risk women. Pap smear every 3 years between 60 and 5 years old. Pap smear every 3-5 years between women 30 and older if pap smear negative and  HPV screening negative.   -Breast cancer: Mammogram: There is disagreement between experts about when to start screening in low risk asymptomatic female but recent recommendations are to start screening at 56 and not later than 59 years old , every 1-2 years and after 59 yo q 2 years. Screening is recommended until 59 years old but some women can continue screening depending of healthy issues.  Colon cancer screening: Has been recently changed to 59 yo. Insurance may not cover until you are 59 years old. Screening is recommended until 59 years old.  Cholesterol disorder screening at age 16 and every 3 years.N/A  Also recommended:  1. Dental visit- Brush and floss your teeth twice daily; visit your dentist twice a year. 2. Eye doctor- Get an eye exam at least every 2 years. 3. Helmet use- Always wear a helmet when riding a bicycle, motorcycle, rollerblading or skateboarding. 4. Safe sex- If you may be exposed to sexually transmitted infections, use a condom. 5. Seat belts- Seat belts can save your live; always wear one. 6. Smoke/Carbon Monoxide detectors- These detectors need to be installed on the appropriate level of your home. Replace batteries at least once a year. 7. Skin cancer- When out in the sun please cover up and use sunscreen 15 SPF or higher. 8. Violence- If anyone is threatening or hurting you, please tell your healthcare provider.  9. Drink alcohol in moderation- Limit alcohol intake to one drink or less per day. Never drink and drive. 10. Calcium supplementation 1000 to 1200 mg daily, ideally through your diet.  Vitamin D supplementation 800  units daily.

## 2020-06-22 NOTE — Progress Notes (Signed)
HPI: Rachel Love is a 59 y.o. female, who is here today for her routine physical.  Last CPE: 06/22/19.  Regular exercise 3 or more time per week: She has not been consistent, she does 10,000-14,000 steps daily. Following a healthy diet: She is following a healthier diet. She lives with her daughter.  Chronic medical problems: HTN,HLD,prediabetes among some.  Pap smear: 05/2017 negative for malignacy and HPV not detected.She would like pap smear done today. Hx of abnormal pap smears: Negative. Hx of STD's: Negative. She is not sexually active.  M: 12-13 G:1 L:1 A:0 LMP 3 years ago. She did not breastfeed.  Immunization History  Administered Date(s) Administered  . Influenza,inj,Quad PF,6+ Mos 05/27/2017, 06/16/2018, 06/22/2019  . Tdap 06/22/2019   Mammogram: 07/09/18 Colonoscopy: 11/28/11 DEXA: N/A Hep C screening: 05/27/17 NR  She has some concerns today that she would like to address.  Feet numbness: Getting worse. For the past few months she has had bilateral plantar numbness bilateral. Gradual onset. Last years she was having numbness in 5th toe. Sometimes back pain, not as bad as it was a couple years ago. Negative for saddle anesthesia,bowel/bladder dysfunction. Occasionally she has back pain, it was radiated to RLE.  Neck pain: Right sided pulling sensation when she is not wearing her bra. When she wears her bra she feels better but it does not go away. No injuries.  HTN: She is on Amlodipine 10 mg daily. Home BP's 110's/70's. Negative for severe/ferequent headache,visual changes, CP,orthpnea,or PND. Hyperkalemia, K+ was not repeated.  Lab Results  Component Value Date   CREATININE 0.66 06/30/2019   BUN 17 06/30/2019   NA 135 06/30/2019   K 5.7 (H) 06/30/2019   CL 102 06/30/2019   CO2 27 06/30/2019   HLD: She is on Pravastatin 80 mg daily. She has tolerated medication well.  Lab Results  Component Value Date   CHOL 225 (H)  06/30/2019   HDL 53.60 06/30/2019   LDLCALC 161 (H) 06/30/2019   TRIG 52.0 06/30/2019   CHOLHDL 4 06/30/2019   Prediabetes:Negative for polydipsia,polyuria, or polyphagia.  Lab Results  Component Value Date   HGBA1C 6.0 06/30/2019   A few months ago she called requesting Rx for Trazodone. She took medication for anxiety and insomnia after the death of her husband. Decided to discontinue because did not feel like she needed it. She states that she is sleeping better, does not need medication at this time.  Vit D deficiency: Last 69 OH vit D was 35 in 09/2016. Not on vit D supplementation..  Review of Systems  Constitutional: Positive for fatigue. Negative for appetite change and fever.  HENT: Negative for dental problem, hearing loss, mouth sores and sore throat.   Eyes: Negative for redness and visual disturbance.  Respiratory: Negative for cough, shortness of breath and wheezing.   Cardiovascular: Negative for leg swelling.  Gastrointestinal: Negative for abdominal pain, nausea and vomiting.       No changes in bowel habits.  Endocrine: Negative for cold intolerance and heat intolerance.  Genitourinary: Negative for decreased urine volume, dysuria, hematuria, vaginal bleeding and vaginal discharge.  Musculoskeletal: Positive for arthralgias and back pain. Negative for gait problem and myalgias.  Skin: Negative for color change and rash.  Allergic/Immunologic: Positive for environmental allergies.  Neurological: Negative for syncope, facial asymmetry and weakness.  Hematological: Negative for adenopathy. Does not bruise/bleed easily.  Psychiatric/Behavioral: Negative for confusion. The patient is nervous/anxious.   All other systems reviewed and are negative.  Current Outpatient Medications on File Prior to Visit  Medication Sig Dispense Refill  . amLODipine (NORVASC) 10 MG tablet TAKE 1 TABLET(10 MG) BY MOUTH DAILY 90 tablet 3  . fluticasone (FLONASE) 50 MCG/ACT nasal spray  Place 1 spray into both nostrils 2 (two) times daily. 16 g 3  . pravastatin (PRAVACHOL) 80 MG tablet Take 1 tablet (80 mg total) by mouth daily. 90 tablet 1   No current facility-administered medications on file prior to visit.   Past Medical History:  Diagnosis Date  . Chicken pox   . Hyperlipidemia   . Hypertension     Past Surgical History:  Procedure Laterality Date  . UTERINE FIBROID SURGERY  2003    Allergies  Allergen Reactions  . Other     Family History  Problem Relation Age of Onset  . Hypertension Mother   . Cancer Father        lung  . Heart disease Maternal Uncle        CAD  . Breast cancer Neg Hx     Social History   Socioeconomic History  . Marital status: Widowed    Spouse name: Not on file  . Number of children: Not on file  . Years of education: Not on file  . Highest education level: Not on file  Occupational History  . Not on file  Tobacco Use  . Smoking status: Never Smoker  . Smokeless tobacco: Never Used  Substance and Sexual Activity  . Alcohol use: No  . Drug use: No  . Sexual activity: Yes  Other Topics Concern  . Not on file  Social History Narrative  . Not on file   Social Determinants of Health   Financial Resource Strain: Not on file  Food Insecurity: Not on file  Transportation Needs: Not on file  Physical Activity: Not on file  Stress: Not on file  Social Connections: Not on file   Vitals:   06/22/20 0951  BP: 128/70  Pulse: 98  Resp: 16  Temp: 98.2 F (36.8 C)  SpO2: 98%   Body mass index is 32.99 kg/m.  Wt Readings from Last 3 Encounters:  06/22/20 204 lb 6 oz (92.7 kg)  06/22/19 217 lb (98.4 kg)  06/16/18 219 lb (99.3 kg)   Physical Exam Vitals and nursing note reviewed. Exam conducted with a chaperone present.  Constitutional:      General: She is not in acute distress.    Appearance: She is well-developed.  HENT:     Head: Normocephalic and atraumatic.     Right Ear: Hearing, tympanic membrane,  ear canal and external ear normal.     Left Ear: Hearing, tympanic membrane, ear canal and external ear normal.     Mouth/Throat:     Mouth: Oropharynx is clear and moist and mucous membranes are normal. Mucous membranes are moist.     Pharynx: Oropharynx is clear. Uvula midline.  Eyes:     Extraocular Movements: Extraocular movements intact and EOM normal.     Conjunctiva/sclera: Conjunctivae normal.     Pupils: Pupils are equal, round, and reactive to light.  Neck:     Thyroid: Thyromegaly (? left thyroid nodule.) present.     Trachea: No tracheal deviation.  Cardiovascular:     Rate and Rhythm: Normal rate and regular rhythm.     Pulses:          Dorsalis pedis pulses are 2+ on the right side and 2+ on the left side.  Heart sounds: No murmur heard.   Pulmonary:     Effort: Pulmonary effort is normal. No respiratory distress.     Breath sounds: Normal breath sounds.  Chest:  Breasts:     Right: No axillary adenopathy or supraclavicular adenopathy.     Left: No axillary adenopathy or supraclavicular adenopathy.    Abdominal:     Palpations: Abdomen is soft. There is no hepatomegaly or mass.     Tenderness: There is no abdominal tenderness.  Genitourinary:    Exam position: Lithotomy position.     Labia:        Right: No rash, tenderness or lesion.        Left: No rash, tenderness or lesion.      Vagina: No vaginal discharge, erythema, tenderness, bleeding, lesions or prolapsed vaginal walls.     Cervix: No cervical motion tenderness, discharge or friability.     Uterus: Not enlarged and not tender.      Adnexa:        Right: No mass, tenderness or fullness.         Left: No mass, tenderness or fullness.       Comments: Breast: No masses, skin abnormalities, or nipple discharge appreciated bilateral. Pap smear collected.  Musculoskeletal:        General: No edema.     Cervical back: No tenderness or bony tenderness. Normal range of motion.     Lumbar back: No  tenderness or bony tenderness.     Right hip: Decreased range of motion.     Left hip: Decreased range of motion.     Comments: No signs of synovitis appreciated.  Lymphadenopathy:     Cervical: No cervical adenopathy.     Upper Body:     Right upper body: No supraclavicular or axillary adenopathy.     Left upper body: No supraclavicular or axillary adenopathy.     Lower Body: No right inguinal adenopathy. No left inguinal adenopathy.  Skin:    General: Skin is warm.     Findings: No erythema or rash.  Neurological:     General: No focal deficit present.     Mental Status: She is alert and oriented to person, place, and time.     Cranial Nerves: No cranial nerve deficit.     Coordination: Coordination normal.     Gait: Gait normal.     Deep Tendon Reflexes: Strength normal.     Reflex Scores:      Bicep reflexes are 2+ on the right side and 2+ on the left side.      Patellar reflexes are 2+ on the right side and 2+ on the left side. Psychiatric:        Mood and Affect: Mood is anxious.     Comments: Well groomed, good eye contact.   ASSESSMENT AND PLAN:  Ms. Rachel Love was here today annual physical examination.  Orders Placed This Encounter  Procedures  . DEXAScan  . US THYROID  . MR Lumbar Spine Wo Contrast  . Varicella-zoster vaccine IM  . Vitamin B12  . TSH  . CBC  . Hemoglobin A1c  . Lipid panel   Lab Results  Component Value Date   CHOL 206 (H) 06/27/2020   HDL 49.80 06/27/2020   LDLCALC 146 (H) 06/27/2020   TRIG 55.0 06/27/2020   CHOLHDL 4 06/27/2020    Lab Results  Component Value Date   HGBA1C 6.1 06/27/2020   Lab Results  Component Value  Date   CREATININE 0.69 06/27/2020   BUN 14 06/27/2020   NA 139 06/27/2020   K 5.0 06/27/2020   CL 105 06/27/2020   CO2 26 06/27/2020   Lab Results  Component Value Date   ALT 16 06/27/2020   AST 29 06/27/2020   ALKPHOS 79 06/27/2020   BILITOT 0.5 06/27/2020    Routine general medical  examination at a health care facility She understands the importance of regular physical activity and healthy diet for prevention of chronic illness and/or complications. Preventive guidelines reviewed. Vaccination updated. Ca++ and vit D supplementation recommended. Next CPE in a year.  The 10-year ASCVD risk score Denman George(Goff [place] Montez HagemanJr., et al., 2013) is: 7%   Values used to calculate the score:     Age: [age] years     Sex: Female     Is Non-Hispanic African American: Yes     Diabetic: No     Tobacco smoker: No     Systolic Blood Pressure: 128 mmHg     Is BP treated: Yes     HDL Cholesterol: 49.8 mg/dL     Total Cholesterol: 206 mg/dL  Vitamin D deficiency Further recommendations according to 25 OH vit D result. For now continue Vit D 800 U daily.  Hypertension, essential, benign BP adequately controlled. Continue Amlodipine 5 mg daily. Continue monitoring BP at home.  Hyperlipidemia, unspecified hyperlipidemia type Continue Pravastatin 80 mg daily. Further recommendations according to FLP results.  Numbness and tingling of both feet We discussed possible etiologies. Problem seems to be getting worse. Lumbar MRI will be arranged. Instructed about warning signs.  Asymptomatic postmenopausal estrogen deficiency -     DEXAScan; Future  Screening for endocrine, metabolic and immunity disorder -     COMPLETE METABOLIC PANEL WITH GFR; Future -     Hemoglobin A1c; Future  Cervical cancer screening -     PAP [[place]]  Enlarged thyroid gland ? Thyroid nodule. Thyroid US will be arranged.  Need for shingles vaccine -     Varicella-zoster vaccine IM   Return in about 6 months (around 12/21/2020) for HTN.  Arthur Speagle G. [place]az[place], MD  [facility]. [place] [facility].   Today you have you routine preventive visit. A few things to remember from today's visit:   Routine general medical examination at a health care facility  Vitamin D deficiency  Hypertension,  essential, benign  Hyperlipidemia, unspecified hyperlipidemia type - Plan: Lipid panel  Numbness and tingling of both feet - Plan: Vitamin B12, TSH, CBC, MR Lumbar Spine Wo Contrast  Asymptomatic postmenopausal estrogen deficiency - Plan: DEXAScan  Screening for endocrine, metabolic and immunity disorder - Plan: COMPLETE METABOLIC PANEL WITH GFR, Hemoglobin A1c  Cervical cancer screening - Plan: PAP [[facility]]  Enlarged thyroid gland - Plan: US THYROID  If you need refills please call your pharmacy. Do not use My Chart to request refills or for acute issues that need immediate attention.    Please be sure medication list is accurate. If a new problem present, please set up appointment sooner than planned today.   At least 150 minutes of moderate exercise per week, daily brisk walking for 15-30 min is a good exercise option. Healthy diet low in saturated (animal) fats and sweets and consisting of fresh fruits and vegetables, lean meats such as fish and white chicken and whole grains.  These are some of recommendations for screening depending of age and risk factors:  - Vaccines:  Tdap vaccine every 10 years.  Shingles vaccine recommended at age 62, could be given after 59 years of age but not sure about insurance coverage.   Pneumonia vaccines: Pneumovax at 65. Sometimes Pneumovax is giving earlier if history of smoking, lung disease,diabetes,kidney disease among some.  Screening for diabetes at age 23 and every 3 years.  Cervical cancer prevention:  Pap smear starts at 59 years of age and continues periodically until 59 years old in low risk women. Pap smear every 3 years between 49 and 58 years old. Pap smear every 3-5 years between women 30 and older if pap smear negative and HPV screening negative.   -Breast cancer: Mammogram: There is disagreement between experts about when to start screening in low risk asymptomatic female but recent recommendations are to start  screening at 70 and not later than 59 years old , every 1-2 years and after 59 yo q 2 years. Screening is recommended until 59 years old but some women can continue screening depending of healthy issues.  Colon cancer screening: Has been recently changed to 59 yo. Insurance may not cover until you are 59 years old. Screening is recommended until 59 years old.  Cholesterol disorder screening at age 24 and every 3 years.N/A  Also recommended:  1. Dental visit- Brush and floss your teeth twice daily; visit your dentist twice a year. 2. Eye doctor- Get an eye exam at least every 2 years. 3. Helmet use- Always wear a helmet when riding a bicycle, motorcycle, rollerblading or skateboarding. 4. Safe sex- If you may be exposed to sexually transmitted infections, use a condom. 5. Seat belts- Seat belts can save your live; always wear one. 6. Smoke/Carbon Monoxide detectors- These detectors need to be installed on the appropriate level of your home. Replace batteries at least once a year. 7. Skin cancer- When out in the sun please cover up and use sunscreen 15 SPF or higher. 8. Violence- If anyone is threatening or hurting you, please tell your healthcare provider.  9. Drink alcohol in moderation- Limit alcohol intake to one drink or less per day. Never drink and drive. 10. Calcium supplementation 1000 to 1200 mg daily, ideally through your diet.  Vitamin D supplementation 800 units daily.

## 2020-06-27 ENCOUNTER — Other Ambulatory Visit (INDEPENDENT_AMBULATORY_CARE_PROVIDER_SITE_OTHER): Payer: 59

## 2020-06-27 ENCOUNTER — Other Ambulatory Visit: Payer: Self-pay

## 2020-06-27 DIAGNOSIS — Z13228 Encounter for screening for other metabolic disorders: Secondary | ICD-10-CM | POA: Diagnosis not present

## 2020-06-27 DIAGNOSIS — Z130 Encounter for screening for diseases of the blood and blood-forming organs and certain disorders involving the immune mechanism: Secondary | ICD-10-CM

## 2020-06-27 DIAGNOSIS — Z1329 Encounter for screening for other suspected endocrine disorder: Secondary | ICD-10-CM

## 2020-06-27 DIAGNOSIS — E785 Hyperlipidemia, unspecified: Secondary | ICD-10-CM

## 2020-06-27 LAB — COMPREHENSIVE METABOLIC PANEL
ALT: 16 U/L (ref 0–35)
AST: 29 U/L (ref 0–37)
Albumin: 4.1 g/dL (ref 3.5–5.2)
Alkaline Phosphatase: 79 U/L (ref 39–117)
BUN: 14 mg/dL (ref 6–23)
CO2: 26 mEq/L (ref 19–32)
Calcium: 9.4 mg/dL (ref 8.4–10.5)
Chloride: 105 mEq/L (ref 96–112)
Creatinine, Ser: 0.69 mg/dL (ref 0.40–1.20)
GFR: 95.19 mL/min (ref >60.00)
Glucose, Bld: 101 mg/dL — ABNORMAL HIGH (ref 70–99)
Potassium: 5 mEq/L (ref 3.5–5.1)
Sodium: 139 mEq/L (ref 135–145)
Total Bilirubin: 0.5 mg/dL (ref 0.2–1.2)
Total Protein: 7.6 g/dL (ref 6.0–8.3)

## 2020-06-27 LAB — LIPID PANEL
Cholesterol: 206 mg/dL — ABNORMAL HIGH (ref 0–200)
HDL: 49.8 mg/dL (ref >39.00)
LDL Cholesterol: 146 mg/dL — ABNORMAL HIGH (ref 0–99)
NonHDL: 156.53
Total CHOL/HDL Ratio: 4
Triglycerides: 55 mg/dL (ref 0.0–149.0)
VLDL: 11 mg/dL (ref 0.0–40.0)

## 2020-06-27 LAB — HEMOGLOBIN A1C: Hgb A1c MFr Bld: 6.1 % (ref 4.6–6.5)

## 2020-06-27 NOTE — Addendum Note (Signed)
Addended by: Leonette Nutting on: 06/27/2020 09:47 AM   Modules accepted: Orders

## 2020-06-28 LAB — CYTOLOGY - PAP
Comment: NEGATIVE
Diagnosis: NEGATIVE
High risk HPV: NEGATIVE

## 2020-07-15 ENCOUNTER — Ambulatory Visit
Admission: RE | Admit: 2020-07-15 | Discharge: 2020-07-15 | Disposition: A | Discharge status: Home / Self Care | Payer: 59 | Source: Ambulatory Visit | Attending: Family Medicine | Admitting: Family Medicine

## 2020-07-15 DIAGNOSIS — E049 Nontoxic goiter, unspecified: Secondary | ICD-10-CM

## 2020-07-22 ENCOUNTER — Other Ambulatory Visit: Payer: Self-pay | Admitting: Family Medicine

## 2020-07-22 ENCOUNTER — Other Ambulatory Visit: Payer: 59

## 2020-07-22 DIAGNOSIS — I10 Essential (primary) hypertension: Secondary | ICD-10-CM

## 2020-07-27 ENCOUNTER — Other Ambulatory Visit: Payer: Self-pay

## 2020-07-27 ENCOUNTER — Ambulatory Visit
Admission: RE | Admit: 2020-07-27 | Discharge: 2020-07-27 | Disposition: A | Discharge status: Home / Self Care | Payer: 59 | Source: Ambulatory Visit | Attending: Family Medicine | Admitting: Family Medicine

## 2020-07-27 DIAGNOSIS — Z1231 Encounter for screening mammogram for malignant neoplasm of breast: Secondary | ICD-10-CM

## 2020-08-08 ENCOUNTER — Other Ambulatory Visit: Payer: Self-pay | Admitting: Family Medicine

## 2020-09-13 ENCOUNTER — Telehealth: Payer: Self-pay | Admitting: Family Medicine

## 2020-09-13 DIAGNOSIS — Z1211 Encounter for screening for malignant neoplasm of colon: Secondary | ICD-10-CM

## 2020-09-13 NOTE — Telephone Encounter (Signed)
Patient states per MyChart:  Colonoscopy (Pts 45-57yrs Insurance Coverage Will Need To Be Confirmed)  She also stated she just came in for a physical so doesn't need an appointment, just to be set up for her colonoscopy.

## 2020-09-13 NOTE — Telephone Encounter (Signed)
Referral placed for colonoscopy. 

## 2020-09-23 ENCOUNTER — Encounter: Payer: Self-pay | Admitting: Internal Medicine

## 2020-10-03 ENCOUNTER — Other Ambulatory Visit: Payer: 59

## 2020-10-15 ENCOUNTER — Ambulatory Visit
Admission: RE | Admit: 2020-10-15 | Discharge: 2020-10-15 | Disposition: A | Discharge status: Home / Self Care | Payer: 59 | Source: Ambulatory Visit | Attending: Family Medicine | Admitting: Family Medicine

## 2020-10-15 ENCOUNTER — Other Ambulatory Visit: Payer: Self-pay

## 2020-10-15 DIAGNOSIS — Z780 Asymptomatic menopausal state: Secondary | ICD-10-CM

## 2020-10-17 ENCOUNTER — Other Ambulatory Visit: Payer: Self-pay | Admitting: Family Medicine

## 2020-10-28 ENCOUNTER — Other Ambulatory Visit: Payer: Self-pay

## 2020-10-28 ENCOUNTER — Encounter: Payer: Self-pay | Admitting: Family Medicine

## 2020-10-28 ENCOUNTER — Ambulatory Visit: Payer: 59 | Admitting: Family Medicine

## 2020-10-28 VITALS — BP 124/76 | HR 78 | Temp 98.4°F | Wt 204.2 lb

## 2020-10-28 DIAGNOSIS — T783XXA Angioneurotic edema, initial encounter: Secondary | ICD-10-CM | POA: Diagnosis not present

## 2020-10-28 DIAGNOSIS — K1321 Leukoplakia of oral mucosa, including tongue: Secondary | ICD-10-CM

## 2020-10-28 MED ORDER — MAGIC MOUTHWASH: 5.0000 mL | Freq: Three times a day (TID) | ORAL | 0 refills | Status: DC | PRN

## 2020-10-28 MED ORDER — CETIRIZINE HCL 10 MG PO TABS: 10.0000 mg | ORAL_TABLET | Freq: Every day | ORAL | 0 refills | Status: DC

## 2020-10-28 MED ORDER — CETIRIZINE HCL 10 MG PO TABS: 10.0000 mg | ORAL_TABLET | Freq: Every day | ORAL | 11 refills | Status: DC

## 2020-10-28 NOTE — Progress Notes (Signed)
Subjective:    Patient ID: Rachel Love, female    DOB: [birth year], 60 y.o.   MRN: [id number]  Chief Complaint  Patient presents with  . Allergic Reaction    HPI Patient was seen today for acute concern.  Pt developed lip edema, tingling in lip, and a chalky taste in her mouth at 5 pm after eating crackers around 2 pm and grapes around 3 pm. Pt took benadryly for her symptoms. Pt denies SOB, CP, rash, n/v, or pruritis. Pt seen via evist for a sinus issues, took coricidin, zofran, mucinex, and ibuprofen.  Symptoms mostly resolved, still with mild edema and chalky taste in mouth.  Past Medical History:  Diagnosis Date  . Chicken pox   . Hyperlipidemia   . Hypertension     Allergies  Allergen Reactions  . Other    Family History  Problem Relation Age of Onset  . Hypertension Mother   . Cancer Father        lung  . Heart disease Maternal Uncle        CAD  . Breast cancer Neg Hx     ROS General: Denies fever, chills, night sweats, changes in weight, changes in appetite HEENT: Denies headaches, ear pain, changes in vision, rhinorrhea, sore throat  +chalky taste in mouth CV: Denies CP, palpitations, SOB, orthopnea Pulm: Denies SOB, cough, wheezing GI: Denies abdominal pain, nausea, vomiting, diarrhea, constipation GU: Denies dysuria, hematuria, frequency, vaginal discharge Msk: Denies muscle cramps, joint pains Neuro: Denies weakness, numbness, tingling Skin: Denies rashes, bruising  +lip edema Psych: Denies depression, anxiety, hallucinations     Objective:    Blood pressure 124/76, pulse 78, temperature 98.4 F (36.9 C), temperature source Oral, weight 204 lb 3.2 oz (92.6 kg), last menstrual period 02/22/2013, SpO2 99 %.   Gen. Pleasant, well-nourished, in no distress, normal affect HEENT: [place]/AT, face symmetric, conjunctiva clear, no scleral icterus, PERRLA, EOMI, nares patent without drainage, leukoplakia of tongue, pharynx without erythema or exudate. Lungs:  no accessory muscle use, CTAB, no wheezes or rales Cardiovascular: RRR, no m/r/g, no peripheral edema Abdomen: BS present, soft, NT/ND, no hepatosplenomegaly. Musculoskeletal: No deformities, no cyanosis or clubbing, normal tone Neuro:  A&Ox3, CN II-XII intact, normal gait Skin:  Warm, no lesions/ rash.  Mild R sided lip edema.   Wt Readings from Last 3 Encounters:  10/28/20 204 lb 3.2 oz (92.6 kg)  06/22/20 204 lb 6 oz (92.7 kg)  06/22/19 217 lb (98.4 kg)    Lab Results  Component Value Date   WBC 6.8 06/30/2019   HGB 11.7 (L) 06/30/2019   HCT 37.1 06/30/2019   PLT 278.0 06/30/2019   GLUCOSE 101 (H) 06/27/2020   CHOL 206 (H) 06/27/2020   TRIG 55.0 06/27/2020   HDL 49.80 06/27/2020   LDLCALC 146 (H) 06/27/2020   ALT 16 06/27/2020   AST 29 06/27/2020   NA 139 06/27/2020   K 5.0 06/27/2020   CL 105 06/27/2020   CREATININE 0.69 06/27/2020   BUN 14 06/27/2020   CO2 26 06/27/2020   TSH 1.46 06/30/2019   HGBA1C 6.1 06/27/2020    Assessment/Plan:  Angioedema, initial encounter -discussed possible causes including allergic rxn to food, chemical, medication, or idiopathic.  No use of ACE-I or ARB. -continue treatment of symptoms with antihistamine such as claritin or zyrtec to avoid drowsiness from benadryl -discussed OTC H2 blocker  -continue to monitor -given strict precautions    - Plan: cetirizine (ZYRTEC) 10 MG tablet  Leukoplakia of  tongue  - Plan: magic mouthwash SOLN  F/u prn for continued or worsened symptoms  Abbe Amsterdam, MD

## 2020-10-28 NOTE — Patient Instructions (Signed)
Angioedema Angioedema is the sudden swelling of tissue in the body. Angioedema can affect any part of the body, but it most often affects the deeper parts of the skin. It can cause puffiness or swelling in the legs, hands, genitals, face, mouth, lips, and even intestines. Depending on the cause, angioedema may happen just once. However, some people may have repeated bouts of angioedema during their lives. Symptoms may be mild and may occur in conjunction with other allergic symptoms such as hives. Severe angioedema can be life-threatening if it affects the air passages and obstructs breathing. What are the causes? This condition may be caused by:  Foods, such as milk, eggs, shellfish, wheat, or nuts.  Certain medicines, such as ACE inhibitors, antibiotics, NSAIDs, birth control pills, or dyes used in X-rays. Hereditary angioedema (HAE) is genetic. Episodes can be triggered by:  Illness, infection, or emotional or physical stress.  Changes in hormone levels.  Exercise.  Minor surgical or dental procedures. In some cases, the cause of this condition is not known. What increases the risk? You are more likely to develop HAE if you have family members with this condition.  What are the signs or symptoms? Symptoms of this condition depend on where the swelling happens.  Symptoms of this condition include:  Swollen skin.  Red, itchy patches of skin (hives).  Pain, pressure, or tenderness in the affected area.  Swollen eyelids, face, lips, or tongue.  Wheezing.  Difficulty drinking, swallowing, or closing the mouth completely.  Hoarseness or sore throat.  Difficulty breathing. If your internal organs are affected, symptoms may also include:  Nausea.  Pain in the abdomen.  Vomiting or diarrhea.  Difficulty swallowing.  Difficulty passing urine.   How is this diagnosed? This condition may be diagnosed based on:  An exam of the affected area.  Your medical  history.  Whether anyone in your family has had this condition before.  A review of any medicines you have been taking.  Tests, including: ? Allergy skin tests to see if the condition was caused by an allergic reaction. ? Blood tests to see if the condition was caused by certain inherited or genetic diseases. How is this treated? Treatment for this condition depends on the cause and severity of your symptoms. It may involve any of the following:  Avoiding triggers, if they are known (this may include foods or environmental allergens).  Stopping medicines permanently if they cause the condition. These include ACE inhibitors.  Taking medicines to treat symptoms or prevent future episodes. These may include: ? Antihistamines. ? Epinephrine injections. ? Steroids. ? Blood products to treat specific types of non-allergic angioedema.  Breathing tubes or ventilators in severe cases in which breathing is affected. Severe cases of angioedema are treated at the hospital. Mild to moderate angioedema usually gets better in 24-48 hours. Follow these instructions at home:  Take over-the-counter and prescription medicines only as told by your health care provider.  If you were given medicines for emergency allergy treatment, always carry them with you. This includes epinephrine injector kits.  Wear a medical bracelet as told by your health care provider.  If something triggers your condition, avoid the trigger. Triggers can be foods, environmental allergens, stress, or exercise.  Avoid all medicines that caused your angioedema. This is for your entire life.  If your condition is inherited and you are thinking about having children, talk to your health care provider. It is important to discuss the risks of passing on the condition to  your children.   Where to find more information  American Academy of Allergy Asthma & Immunology: www.aaaai.org Contact a health care provider if:  You  continue to have repeated episodes of angioedema.  Episodes of angioedema start to happen more often than they used to, even after you take steps to prevent them.  You have episodes of angioedema that are more severe than they have been before, even after you take steps to prevent them.  You are thinking about having children. Get help right away if:  You have severe swelling of your mouth, tongue, or lips.  Your swelling is worsening.  You have trouble breathing, swallowing, or talking.  You have chest pain, dizziness or light-headedness, or you pass out. These symptoms may represent a serious problem that is an emergency. Do not wait to see if the symptoms will go away. Get medical help right away. Call your local emergency services (911 in the [place] Do not drive yourself to the hospital. Summary  Angioedema is the sudden swelling of tissues, and it often affects the deeper parts of the skin.  It is important to be aware of all triggers or causes for your angioedema and to avoid them. Make sure you have a plan in place in case you develop angioedema.  Treatment for this condition depends on the cause and severity of your symptoms.  Severe angioedema can be life-threatening if it obstructs the air passages. It needs to be treated right away in the hospital. This information is not intended to replace advice given to you by your health care provider. Make sure you discuss any questions you have with your health care provider. Document Revised: 05/26/2019 Document Reviewed: 05/26/2019 Elsevier Patient Education  2021 [employer] Inc.  Leukoplakia Leukoplakia refers to white patches that develop in your mouth. These patches may show up on the insides of your cheeks, on your lips, on or under your tongue, or on your gums. Leukoplakia can also develop on the genitals or in the area around the anus, but this is rare. Leukoplakia usually goes away with treatment. In some cases, leukoplakia  can indicate an increased risk for cancer. What are the causes? Many conditions can cause or increase the risk for leukoplakia in the mouth. These may include:  Any type of tobacco use, especially when combined with the use of alcohol.  Irritation of the mouth from rough teeth or dentures.  Having a weakened disease-fighting system (immune system). What are the signs or symptoms? The main symptom of this condition is the development of patches or flat areas in the mouth. These patches may:  Have an odd shape.  Be hard.  Be raised.  Be white, gray, or speckled red and white in color. Some areas may be reddened.  Be hard to wipe or scrape away. Scraping the patches may cause bleeding.  Be sensitive to touch, heat, or foods that are spicy or acidic.   How is this diagnosed? This condition is diagnosed based on:  A physical exam. Your health care provider can usually make a diagnosis by closely examining the affected area.  A test in which a sample of affected skin is removed and then checked under a microscope (biopsy). This test is used to confirm the diagnosis and to rule out other serious conditions, such as cancer. How is this treated? Treatment for this condition may include:  Stopping tobacco use.  Repairing any rough teeth or dentures.  Surgery to remove the patches. This may be done  using a surgical knife (scalpel), laser, heat, or cold.  Medicines that can be taken by mouth.  Medicines that can be applied to the patches. Follow these instructions at home: Eating and drinking  Avoid foods or drinks that seem to irritate the patches.  Eat a healthy diet including fresh fruits and vegetables, lean proteins, and whole grains. General instructions  Take or apply over-the-counter and prescription medicines only as told by your health care provider.  Check with your dentist to see if you need any repairs to teeth or dentures.  Do not use any products that contain  nicotine or tobacco, such as cigarettes and e-cigarettes. If you need help quitting, ask your health care provider.  Keep all follow-up visits as told by your health care provider. This is important.   Alcohol use  Do not drink alcohol if: ? Your health care provider tells you not to drink. ? You are pregnant, may be pregnant, or are planning to become pregnant.  If you drink alcohol, limit how much you have: ? 0-1 drink a day for women. ? 0-2 drinks a day for men.  Be aware of how much alcohol is in your drink. In the [place] one drink equals one typical bottle of beer (12 oz), one-half glass of wine (5 oz), or one shot of hard liquor (1 oz). Contact a health care provider if you:  Develop new patches of leukoplakia.  Notice changes in the size, shape, or feel of existing patches.  Have a fever. Get help right away if you have:  Severe pain in the area of a patch, and the pain is not helped by prescribed medicine.  Bleeding in the area of a patch, and you cannot stop the bleeding.  A patch in your mouth that becomes so swollen that you have trouble eating or breathing. Summary  Leukoplakia refers to white patches that develop in your mouth. It can also occur in the genitals or in the area around the anus, but this is rare.  A health care provider can usually diagnose this condition by closely examining the affected area. To confirm the diagnosis, a sample of affected skin may be removed and then checked under a microscope (biopsy).  Leukoplakia usually goes away with treatment. In some cases, leukoplakia can indicate an increased risk for cancer.  Treatment may include stopping tobacco or alcohol use, or repairing any rough teeth or dentures. You may also use medicines or have surgery to remove the patches. This information is not intended to replace advice given to you by your health care provider. Make sure you discuss any questions you have with your health care  provider. Document Revised: 03/11/2020 Document Reviewed: 03/11/2020 Elsevier Patient Education  2021 [employer].

## 2020-10-29 ENCOUNTER — Telehealth: Payer: Self-pay | Admitting: Family Medicine

## 2020-10-29 NOTE — Telephone Encounter (Signed)
Received page from pharmacist about magic mouthwash Rx concentrations.  Ok to use dexamethasone 0.5mg /39mL, and 12.5mg /40mL for diphenhydramine as regimen.

## 2020-11-08 ENCOUNTER — Encounter: Payer: Self-pay | Admitting: Family Medicine

## 2020-11-29 ENCOUNTER — Ambulatory Visit (AMBULATORY_SURGERY_CENTER): Payer: Self-pay

## 2020-11-29 ENCOUNTER — Other Ambulatory Visit: Payer: Self-pay

## 2020-11-29 VITALS — Ht 66.0 in | Wt 204.0 lb

## 2020-11-29 DIAGNOSIS — Z1211 Encounter for screening for malignant neoplasm of colon: Secondary | ICD-10-CM

## 2020-11-29 MED ORDER — NA SULFATE-K SULFATE-MG SULF 17.5-3.13-1.6 GM/177ML PO SOLN: 1.0000 | Freq: Once | ORAL | 0 refills | Status: AC

## 2020-11-29 NOTE — Progress Notes (Signed)
No egg or soy allergy known to patient  No issues with past sedation with any surgeries or procedures Patient denies ever being told they had issues or difficulty with intubation  No FH of Malignant Hyperthermia No diet pills per patient No home 02 use per patient  No blood thinners per patient  Pt denies issues with constipation  No A fib or A flutter  EMMI video via MyChart  COVID 19 guidelines implemented in PV today with Pt and RN   NO PA's for preps discussed with pt in PV today  Discussed with pt there will be an out-of-pocket cost for prep and that varies from $0 to 70 dollars   Due to the COVID-19 pandemic we are asking patients to follow certain guidelines.  Pt aware of COVID protocols and [facility] guidelines   

## 2020-12-09 ENCOUNTER — Encounter: Payer: Self-pay | Admitting: Internal Medicine

## 2020-12-12 ENCOUNTER — Other Ambulatory Visit: Payer: Self-pay

## 2020-12-12 ENCOUNTER — Ambulatory Visit (AMBULATORY_SURGERY_CENTER): Payer: 59 | Admitting: Internal Medicine

## 2020-12-12 ENCOUNTER — Encounter: Payer: Self-pay | Admitting: Internal Medicine

## 2020-12-12 VITALS — BP 120/63 | HR 68 | Temp 96.6°F | Resp 18 | Ht 66.0 in | Wt 204.0 lb

## 2020-12-12 DIAGNOSIS — Z1211 Encounter for screening for malignant neoplasm of colon: Secondary | ICD-10-CM

## 2020-12-12 MED ORDER — SODIUM CHLORIDE 0.9 % IV SOLN: 500.0000 mL | Freq: Once | INTRAVENOUS | Status: DC

## 2020-12-12 NOTE — Progress Notes (Signed)
Report given to PACU, vss 

## 2020-12-12 NOTE — Progress Notes (Signed)
Pt's states no medical or surgical changes since previsit or office visit. 

## 2020-12-12 NOTE — Op Note (Signed)
[facility] Patient Name: Rachel Love Procedure Date: 12/12/2020 9:24 AM MRN: [id number] Endoscopist: Beverley Fiedler , MD Age: 60 Referring MD:  Date of Birth: 1960-08-15 Gender: Female Account #: [id number] Procedure:                Colonoscopy Indications:              Screening for colorectal malignant neoplasm, Last                            colonoscopy 10 years ago Medicines:                Monitored Anesthesia Care Procedure:                Pre-Anesthesia Assessment:                           - Prior to the procedure, a History and Physical                            was performed, and patient medications and                            allergies were reviewed. The patient's tolerance of                            previous anesthesia was also reviewed. The risks                            and benefits of the procedure and the sedation                            options and risks were discussed with the patient.                            All questions were answered, and informed consent                            was obtained. Prior Anticoagulants: The patient has                            taken no previous anticoagulant or antiplatelet                            agents. ASA Grade Assessment: II - A patient with                            mild systemic disease. After reviewing the risks                            and benefits, the patient was deemed in                            satisfactory condition to undergo the procedure.  After obtaining informed consent, the colonoscope                            was passed under direct vision. Throughout the                            procedure, the patient's blood pressure, pulse, and                            oxygen saturations were monitored continuously. The                            Olympus CF-HQ190 [id number]) Colonoscope was                            introduced through the anus and advanced  to the                            cecum, identified by appendiceal orifice and                            ileocecal valve. The colonoscopy was performed                            without difficulty. The patient tolerated the                            procedure well. The quality of the bowel                            preparation was good. The ileocecal valve,                            appendiceal orifice, and rectum were photographed. Scope In: 9:33:18 AM Scope Out: 9:46:15 AM Scope Withdrawal Time: 0 hours 8 minutes 3 seconds  Total Procedure Duration: 0 hours 12 minutes 57 seconds  Findings:                 The digital rectal exam was normal.                           The entire examined colon appeared normal on direct                            and retroflexion views. Complications:            No immediate complications. Estimated Blood Loss:     Estimated blood loss: none. Impression:               - The entire examined colon is normal on direct and                            retroflexion views.                           - No specimens collected. Recommendation:           -  Patient has a contact number available for                            emergencies. The signs and symptoms of potential                            delayed complications were discussed with the                            patient. Return to normal activities tomorrow.                            Written discharge instructions were provided to the                            patient.                           - Resume previous diet.                           - Continue present medications.                           - Repeat colonoscopy in 10 years for screening                            purposes. Beverley Fiedler, MD 12/12/2020 9:48:41 AM This report has been signed electronically.

## 2020-12-12 NOTE — Patient Instructions (Signed)
YOU HAD AN ENDOSCOPIC PROCEDURE TODAY AT THE [facility]:   Refer to the procedure report that was given to you for any specific questions about what was found during the examination.  If the procedure report does not answer your questions, please call your gastroenterologist to clarify.  If you requested that your care partner not be given the details of your procedure findings, then the procedure report has been included in a sealed envelope for you to review at your convenience later. ° °YOU SHOULD EXPECT: Some feelings of bloating in the abdomen. Passage of more gas than usual.  Walking can help get rid of the air that was put into your GI tract during the procedure and reduce the bloating. If you had a lower endoscopy (such as a colonoscopy or flexible sigmoidoscopy) you may notice spotting of blood in your stool or on the toilet paper. If you underwent a bowel prep for your procedure, you may not have a normal bowel movement for a few days. ° °Please Note:  You might notice some irritation and congestion in your nose or some drainage.  This is from the oxygen used during your procedure.  There is no need for concern and it should clear up in a day or so. ° °SYMPTOMS TO REPORT IMMEDIATELY: ° °Following lower endoscopy (colonoscopy or flexible sigmoidoscopy): ° Excessive amounts of blood in the stool ° Significant tenderness or worsening of abdominal pains ° Swelling of the abdomen that is new, acute ° Fever of 100°F or higher ° °For urgent or emergent issues, a gastroenterologist can be reached at any hour by calling [phone]. °Do not use MyChart messaging for urgent concerns.  ° ° °DIET:  We do recommend a small meal at first, but then you may proceed to your regular diet.  Drink plenty of fluids but you should avoid alcoholic beverages for 24 hours. ° °ACTIVITY:  You should plan to take it easy for the rest of today and you should NOT DRIVE or use heavy machinery until tomorrow (because of  the sedation medicines used during the test).   ° °FOLLOW UP: °Our staff will call the number listed on your records 48-72 hours following your procedure to check on you and address any questions or concerns that you may have regarding the information given to you following your procedure. If we do not reach you, we will leave a message.  We will attempt to reach you two times.  During this call, we will ask if you have developed any symptoms of COVID 19. If you develop any symptoms (ie: fever, flu-like symptoms, shortness of breath, cough etc.) before then, please call [phone].  If you test positive for Covid 19 in the 2 weeks post procedure, please call and report this information to us.   ° °SIGNATURES/CONFIDENTIALITY: °You and/or your care partner have signed paperwork which will be entered into your electronic medical record.  These signatures attest to the fact that that the information above on your After Visit Summary has been reviewed and is understood.  Full responsibility of the confidentiality of this discharge information lies with you and/or your care-partner.  °

## 2020-12-14 ENCOUNTER — Telehealth: Payer: Self-pay

## 2020-12-14 NOTE — Telephone Encounter (Signed)
LVM

## 2021-01-03 ENCOUNTER — Telehealth: Payer: Self-pay | Admitting: Family Medicine

## 2021-01-03 NOTE — Telephone Encounter (Signed)
Patient tested positive for Covid  today would like a medication called in to help her symptoms [id number] - [place], [place] - [address] AT [facility] [address] [facility] [address]  Phone:[phone] Fax:  [phone]

## 2021-01-03 NOTE — Telephone Encounter (Signed)
Patient is scheduled for 07/06

## 2021-01-03 NOTE — Telephone Encounter (Signed)
Needs appointment

## 2021-01-04 ENCOUNTER — Encounter: Payer: Self-pay | Admitting: Family Medicine

## 2021-01-04 ENCOUNTER — Telehealth (INDEPENDENT_AMBULATORY_CARE_PROVIDER_SITE_OTHER): Payer: 59 | Admitting: Family Medicine

## 2021-01-04 VITALS — Ht 66.0 in

## 2021-01-04 DIAGNOSIS — R059 Cough, unspecified: Secondary | ICD-10-CM

## 2021-01-04 DIAGNOSIS — U071 COVID-19: Secondary | ICD-10-CM | POA: Diagnosis not present

## 2021-01-04 DIAGNOSIS — J309 Allergic rhinitis, unspecified: Secondary | ICD-10-CM

## 2021-01-04 MED ORDER — FLUTICASONE PROPIONATE 50 MCG/ACT NA SUSP: 1.0000 | Freq: Two times a day (BID) | NASAL | 3 refills | Status: DC

## 2021-01-04 MED ORDER — BENZONATATE 100 MG PO CAPS
200.0000 mg | ORAL_CAPSULE | Freq: Two times a day (BID) | ORAL | 0 refills | Status: AC | PRN
Start: 2021-01-04 — End: 2021-01-14

## 2021-01-04 MED ORDER — NIRMATRELVIR/RITONAVIR (PAXLOVID)TABLET: 3.0000 | ORAL_TABLET | Freq: Two times a day (BID) | ORAL | 0 refills | Status: AC

## 2021-01-04 NOTE — Progress Notes (Signed)
Virtual Visit via Video Note I connected with Rachel Love on 01/04/2021 by a video enabled telemedicine application and verified that I am speaking with the correct person using two identifiers.  Location patient: home Location provider:work office Persons participating in the virtual visit: patient, provider  I discussed the limitations of evaluation and management by telemedicine and the availability of in person appointments. The patient expressed understanding and agreed to proceed.  Chief Complaint  Patient presents with   Covid Positive   HPI: Rachel Love is a 60 year old female with history of allergy rhinitis, hyperlipidemia, and hypertension who started with respiratory symptoms about 3 to 4 days ago. She noticed some nasal congestion about 5 days ago while she was in [place], thought to be related with the [facility]. 3 days ago she started with subjective fever (temp 99 F), chills, body aches, rhinorrhea, nasal congestion, dysphonia,sore throat, and productive cough. She was having headache, she has not had it today. Negative for hemoptysis. She has not noted anosmia,ageusia,CP, wheezing, stridor,dyspnea, abdominal pain, nausea, vomiting, changes in bowel habits, urinary symptoms, or skin rash. She has been taking Mucinex, Coricidin, and Tylenol.  She did a home COVID-19 test yesterday because she was planning on visiting her mother and it was positive.  She will repeat a home COVID-19 test today, positive. She would like to take oral antiviral medication.  COVID-19 vaccination completed + booster x1.  ROS: See pertinent positives and negatives per HPI.  Past Medical History:  Diagnosis Date   Chicken pox    Hyperlipidemia    on meds   Hypertension    on meds   Past Surgical History:  Procedure Laterality Date   FOOT SURGERY Right 1977   FOOT SURGERY Bilateral 2008   THERAPEUTIC ABORTION  1978   UTERINE FIBROID SURGERY  2003   Family History  Problem Relation Age of  Onset   Hypertension Mother    Lung cancer Father    Heart disease Maternal Uncle        CAD   Breast cancer Neg Hx    Colon cancer Neg Hx    Colon polyps Neg Hx    Esophageal cancer Neg Hx    Rectal cancer Neg Hx    Stomach cancer Neg Hx     Social History   Socioeconomic History   Marital status: Widowed    Spouse name: Not on file   Number of children: Not on file   Years of education: Not on file   Highest education level: Not on file  Occupational History   Not on file  Tobacco Use   Smoking status: Never   Smokeless tobacco: Never  Vaping Use   Vaping Use: Never used  Substance and Sexual Activity   Alcohol use: No   Drug use: No   Sexual activity: Yes  Other Topics Concern   Not on file  Social History Narrative   Not on file   Social Determinants of Health   Financial Resource Strain: Not on file  Food Insecurity: Not on file  Transportation Needs: Not on file  Physical Activity: Not on file  Stress: Not on file  Social Connections: Not on file  Intimate Partner Violence: Not on file   Current Outpatient Medications:    amLODipine (NORVASC) 10 MG tablet, TAKE 1 TABLET(10 MG) BY MOUTH DAILY, Disp: 90 tablet, Rfl: 3   CALCIUM PO, Take 600 mg by mouth 2 (two) times daily., Disp: , Rfl:    cetirizine (ZYRTEC)  10 MG tablet, Take 1 tablet (10 mg total) by mouth daily., Disp: 30 tablet, Rfl: 0   Cholecalciferol (VITAMIN D3 PO), Take 1 tablet by mouth daily at 6 (six) AM., Disp: , Rfl:    fluticasone (FLONASE) 50 MCG/ACT nasal spray, Place 1 spray into both nostrils 2 (two) times daily., Disp: 16 g, Rfl: 3   pravastatin (PRAVACHOL) 80 MG tablet, TAKE 1 TABLET(80 MG) BY MOUTH DAILY, Disp: 90 tablet, Rfl: 3  EXAM:  VITALS per patient if applicable:Ht 5\' 6"  (1.676 m)   LMP 02/22/2013   BMI 32.93 kg/m   GENERAL: alert, oriented, appears well and in no acute distress  HEENT: atraumatic, conjunctiva clear, no obvious abnormalities on inspection of external  nose and ears Mild dysphonia, no stridor.  NECK: normal movements of the head and neck  LUNGS: on inspection no signs of respiratory distress, breathing rate appears normal, no obvious gross SOB, gasping or wheezing  CV: no obvious cyanosis  MS: moves all visible extremities without noticeable abnormality  PSYCH/NEURO: pleasant and cooperative, no obvious depression or anxiety, speech and thought processing grossly intact  ASSESSMENT AND PLAN:  Discussed the following assessment and plan:  COVID-19 virus infection - Plan: nirmatrelvir/ritonavir EUA (PAXLOVID) TABS We discussed signs and symptoms as well as possible complications. Plenty of p.o. fluids, Tylenol 500 mg 3-4 times per day as needed, and rest recommended. Intranasal Flonase daily as needed and OTC throat lozenges. We reviewed all side effects of Paxlovid and possible medication interaction. No history of liver disease or CKD. She would like to start medication, Paxlovid Rx sent. Instructed to complete 7 days of quarantine, states that she does not need a note for work. Recommend not to visit her mother until completing 10 to 14 days from symptoms onset. Clearly instructed about warning signs.  Cough Explained that cough and congestion can last a few more days and even weeks after acute symptoms have resolved. Continue plain Mucinex and adequate hydration. Benzonatate 100 mg 2 capsules twice daily as needed recommended.  I do not think imaging is needed at this time.  We discussed possible serious and likely etiologies, options for evaluation and workup, limitations of telemedicine visit vs in person visit, treatment, treatment risks and precautions.  I discussed the assessment and treatment plan with the patient.  Rachel Love was provided an opportunity to ask questions and all were answered.  She agreed with the plan and demonstrated an understanding of the instructions.  Return if symptoms worsen or fail to  improve.  Aliea Bobe Mayford Knife, MD

## 2021-01-24 ENCOUNTER — Telehealth: Payer: Self-pay | Admitting: Family Medicine

## 2021-01-24 NOTE — Telephone Encounter (Signed)
The patient seen Dr. [place] on 01/04/2021. Tested positive for COVID on 07/05.   She is now coughing up mucus that is salty and thick  She was wondering if she can take 1200 mg Mucinex OTC since the 600 mg doesn't seem to be working for her.  Please advise

## 2021-01-25 NOTE — Telephone Encounter (Signed)
Cough and congestion can last days and even weeks after acute symptoms have resolved. Continue adequate hydration and plain mucinex. If fever,SOB, wheezing,or worsening symptoms we will need to re-evaluate. Thanks, BJ

## 2021-01-25 NOTE — Telephone Encounter (Signed)
I called and spoke with patient. We went over the information below, she will let us know if she does not improve & will come in to be re-evaluated.

## 2021-04-03 ENCOUNTER — Encounter: Payer: Self-pay | Admitting: Family Medicine

## 2021-04-03 ENCOUNTER — Other Ambulatory Visit: Payer: Self-pay

## 2021-04-03 ENCOUNTER — Ambulatory Visit: Payer: 59 | Admitting: Family Medicine

## 2021-04-03 VITALS — BP 130/78 | HR 96 | Resp 16 | Ht 66.0 in | Wt 187.5 lb

## 2021-04-03 DIAGNOSIS — R200 Anesthesia of skin: Secondary | ICD-10-CM

## 2021-04-03 DIAGNOSIS — R202 Paresthesia of skin: Secondary | ICD-10-CM

## 2021-04-03 DIAGNOSIS — N644 Mastodynia: Secondary | ICD-10-CM

## 2021-04-03 NOTE — Progress Notes (Signed)
Chief Complaint  Patient presents with   Breast Pain    Since august, pain on the left side, but pain behind both nipples.    HPI: [name] is a 60 y.o. female, who is here today with above complaint.  Left nipple pain that started 1-2 months ago. Pulling like sensation breast, bilateral.  Pain is not radiated. Intermittent, mild. Alleviated by holding breast up and exacerbated by not wearing a bra. No hx of trauma. She has not noted breast masses,skin changes,or nipple discharge.  Last mammogram on 07/27/20 [facility]ads 1  Initially she thought pain may have been caused by trauma/muscle strain. She is not taking OTC analgesics. Pain is stable.  Mother s/p breat lumpectomy due to breast cancer.  -Cough, productive since 12/2020 when she was Dx'ed with COVID 19 infection. It is getting better. No associated fever,chills,wheezing,or SOB.  -Bilateral foot numbness, plantar. Started with left toes. She was seen for this problem on 06/21/20, some labs were ordered but were not done. She feels like problem is getting worse. She has not identified exacerbating or alleviating factors. Has had back pain but not recently. Negative for saddle anesthesia,or bowel/bladder dysfunction. Lumbar MRI was ordered but denies by her insurance.  Lab Results  Component Value Date   VITAMINB12 833 06/30/2019   Lab Results  Component Value Date   TSH 1.46 06/30/2019   Lab Results  Component Value Date   WBC 6.8 06/30/2019   HGB 11.7 (L) 06/30/2019   HCT 37.1 06/30/2019   MCV 89.7 06/30/2019   PLT 278.0 06/30/2019   Review of Systems  Constitutional:  Positive for fatigue. Negative for activity change and appetite change.  HENT:  Negative for mouth sores, nosebleeds and trouble swallowing.   Eyes:  Negative for redness and visual disturbance.  Cardiovascular:  Negative for chest pain, palpitations and leg swelling.  Gastrointestinal:  Negative for abdominal pain,  nausea and vomiting.       Negative for changes in bowel habits.  Genitourinary:  Negative for decreased urine volume and hematuria.  Musculoskeletal:  Negative for gait problem.  Skin:  Negative for pallor and rash.  Neurological:  Negative for syncope, weakness and headaches.  Hematological:  Negative for adenopathy. Does not bruise/bleed easily.  Rest of ROS, see pertinent positives sand negatives in HPI  Current Outpatient Medications on File Prior to Visit  Medication Sig Dispense Refill   amLODipine (NORVASC) 10 MG tablet TAKE 1 TABLET(10 MG) BY MOUTH DAILY 90 tablet 3   CALCIUM PO Take 600 mg by mouth 2 (two) times daily.     cetirizine (ZYRTEC) 10 MG tablet Take 1 tablet (10 mg total) by mouth daily. 30 tablet 0   Cholecalciferol (VITAMIN D3 PO) Take 1 tablet by mouth daily at 6 (six) AM.     fluticasone (FLONASE) 50 MCG/ACT nasal spray Place 1 spray into both nostrils 2 (two) times daily. 16 g 3   pravastatin (PRAVACHOL) 80 MG tablet TAKE 1 TABLET(80 MG) BY MOUTH DAILY 90 tablet 3   No current facility-administered medications on file prior to visit.   Past Medical History:  Diagnosis Date   Chicken pox    Hyperlipidemia    on meds   Hypertension    on meds   No Known Allergies  Social History   Socioeconomic History   Marital status: Widowed    Spouse name: Not on file   Number of children: Not on file   Years of education: Not on  file   Highest education level: Not on file  Occupational History   Not on file  Tobacco Use   Smoking status: Never   Smokeless tobacco: Never  Vaping Use   Vaping Use: Never used  Substance and Sexual Activity   Alcohol use: No   Drug use: No   Sexual activity: Yes  Other Topics Concern   Not on file  Social History Narrative   Not on file   Social Determinants of Health   Financial Resource Strain: Not on file  Food Insecurity: Not on file  Transportation Needs: Not on file  Physical Activity: Not on file  Stress: Not  on file  Social Connections: Not on file    Vitals:   04/03/21 1521  BP: 130/78  Pulse: 96  Resp: 16  SpO2: 97%   Body mass index is 30.26 kg/m.  Physical Exam Vitals and nursing note reviewed.  Constitutional:      General: She is not in acute distress.    Appearance: She is well-developed.  HENT:     Head: Normocephalic and atraumatic.     Mouth/Throat:     Mouth: Mucous membranes are moist.     Pharynx: Oropharynx is clear.  Eyes:     Conjunctiva/sclera: Conjunctivae normal.  Cardiovascular:     Rate and Rhythm: Normal rate and regular rhythm.     Heart sounds: No murmur heard. Pulmonary:     Effort: Pulmonary effort is normal. No respiratory distress.     Breath sounds: Normal breath sounds.  Genitourinary:    Comments: No tenderness, breast masses,or nipple discharge on examination, bilateral. Pian behind left nipple is not reproducible, Lymphadenopathy:     Cervical: No cervical adenopathy.  Skin:    General: Skin is warm.     Findings: No erythema or rash.  Neurological:     General: No focal deficit present.     Mental Status: She is alert and oriented to person, place, and time.     Cranial Nerves: No cranial nerve deficit.     Gait: Gait normal.  Psychiatric:     Comments: Well groomed, good eye contact.    ASSESSMENT AND PLAN:  Ms. Rachel Love was seen today for nipple pain.  Orders Placed This Encounter  Procedures   MM DIAG BREAST TOMO UNI LEFT   CBC   RPR   TSH   Vitamin B12   Lab Results  Component Value Date   VITAMINB12 475 04/03/2021   Lab Results  Component Value Date   WBC 5.2 04/03/2021   HGB 12.1 04/03/2021   HCT 38.0 04/03/2021   MCV 90.3 04/03/2021   PLT 295.0 04/03/2021   Lab Results  Component Value Date   TSH 1.85 04/03/2021   1. Nipple pain We discussed possible etiologies. ? Musculoskeletal pain. Hx and examination do not suggest a serious process. Pain is not reproducible on examination today. Dx  mammogram will be arranged.  2. Numbness and tingling of foot Problem is betting worse. Continue appropriate foot care. We discussed pharmacologic treatment options like Gabapentin, she prefers to hold on this for now. If labs are in normal range, we will arrange appt with neurologist.  Return if symptoms worsen or fail to improve, for Keep next appt.   Dahlton Hinde G. [place], MD  [facility]. [place] office.

## 2021-04-03 NOTE — Patient Instructions (Signed)
A few things to remember from today's visit:   Nipple pain - Plan: MM DIAG BREAST TOMO BILATERAL  Numbness and tingling of foot - Plan: CBC, RPR, TSH, Vitamin B12  If you need refills please call your pharmacy. Do not use My Chart to request refills or for acute issues that need immediate attention.   If labs are in normal range I will place referral to neuro for foot numbness.  Please be sure medication list is accurate. If a new problem present, please set up appointment sooner than planned today.

## 2021-04-04 LAB — CBC
HCT: 38 % (ref 36.0–46.0)
Hemoglobin: 12.1 g/dL (ref 12.0–15.0)
MCHC: 31.8 g/dL (ref 30.0–36.0)
MCV: 90.3 fl (ref 78.0–100.0)
Platelets: 295 10*3/uL (ref 150.0–400.0)
RBC: 4.21 Mil/uL (ref 3.87–5.11)
RDW: 14.6 % (ref 11.5–15.5)
WBC: 5.2 10*3/uL (ref 4.0–10.5)

## 2021-04-04 LAB — RPR: RPR Ser Ql: NONREACTIVE

## 2021-04-04 LAB — VITAMIN B12: Vitamin B-12: 475 pg/mL (ref 211–911)

## 2021-04-04 LAB — TSH: TSH: 1.85 u[IU]/mL (ref 0.35–5.50)

## 2021-04-05 ENCOUNTER — Other Ambulatory Visit: Payer: Self-pay | Admitting: Gastroenterology

## 2021-04-05 ENCOUNTER — Other Ambulatory Visit: Payer: Self-pay | Admitting: Family Medicine

## 2021-04-05 DIAGNOSIS — N644 Mastodynia: Secondary | ICD-10-CM

## 2021-04-06 ENCOUNTER — Encounter: Payer: Self-pay | Admitting: Neurology

## 2021-04-11 ENCOUNTER — Telehealth: Payer: Self-pay

## 2021-04-11 NOTE — Telephone Encounter (Signed)
Patient called in wanting to know if she needed an Rx for her B12 and vitamin D, or if it was OTC. Advised pt it was OTC.

## 2021-04-21 ENCOUNTER — Ambulatory Visit
Admission: RE | Admit: 2021-04-21 | Discharge: 2021-04-21 | Disposition: A | Discharge status: Home / Self Care | Payer: 59 | Source: Ambulatory Visit | Attending: Family Medicine | Admitting: Family Medicine

## 2021-04-21 ENCOUNTER — Other Ambulatory Visit: Payer: Self-pay

## 2021-04-21 ENCOUNTER — Ambulatory Visit: Payer: 59

## 2021-04-21 DIAGNOSIS — N644 Mastodynia: Secondary | ICD-10-CM

## 2021-06-22 NOTE — Progress Notes (Signed)
[facility] Clinic Note - Initial Visit   Date: 06/23/21  Rachel Love MRN: [id number] DOB: 12-Dec-1960   Dear Dr. Swaz[place]:  Thank you for your kind referral of Rachel Love for consultation of feet numbness. Although her history is well known to you, please allow [place] to reiterate it for the purpose of our medical record. The patient was accompanied to the clinic by self.    History of Present Illness: Rachel Love is a 60 y.o. left-handed female with hypertension and hyperlipidemia presenting for evaluation of bilateral feet numbness.   Starting around December 2021, she began having numbness involving the soles of both feet. Symptoms are constant. Over time, she feels that it has extended further into the feet. She denies weakness or imbalance.  No pain or tingling.  Sometimes, she gets shooting pain down the right leg. No triggers, exacerbating, or alleviating factors.    No history of diabetes or alcohol use.  Vitamin B12 and TSH is normal.  She works as an Environmental health practitioner. Lives in a one level home with her daughter.   Out-side paper records, electronic medical record, and images have been reviewed where available and summarized as:  Lab Results  Component Value Date   HGBA1C 6.1 06/27/2020   Lab Results  Component Value Date   VITAMINB12 475 04/03/2021   Lab Results  Component Value Date   TSH 1.85 04/03/2021   No results found for: ESRSEDRATE, POCTSEDRATE  Past Medical History:  Diagnosis Date   Chicken pox    Hyperlipidemia    on meds   Hypertension    on meds    Past Surgical History:  Procedure Laterality Date   FOOT SURGERY Right 1977   FOOT SURGERY Bilateral 2008   THERAPEUTIC ABORTION  1978   UTERINE FIBROID SURGERY  2003     Medications:  Outpatient Encounter Medications as of 06/23/2021  Medication Sig   amLODipine (NORVASC) 10 MG tablet TAKE 1 TABLET(10 MG) BY MOUTH DAILY    CALCIUM PO Take 600 mg by mouth 2 (two) times daily.   Cholecalciferol (VITAMIN D3 PO) Take 1 tablet by mouth daily at 6 (six) AM.   fluticasone (FLONASE) 50 MCG/ACT nasal spray Place 1 spray into both nostrils 2 (two) times daily.   pravastatin (PRAVACHOL) 80 MG tablet TAKE 1 TABLET(80 MG) BY MOUTH DAILY   [DISCONTINUED] cetirizine (ZYRTEC) 10 MG tablet Take 1 tablet (10 mg total) by mouth daily. (Patient not taking: Reported on 06/23/2021)   No facility-administered encounter medications on file as of 06/23/2021.    Allergies: No Known Allergies  Family History: Family History  Problem Relation Age of Onset   Hypertension Mother    Lung cancer Father    Heart disease Maternal Uncle        CAD   Breast cancer Neg Hx    Colon cancer Neg Hx    Colon polyps Neg Hx    Esophageal cancer Neg Hx    Rectal cancer Neg Hx    Stomach cancer Neg Hx     Social History: Social History   Tobacco Use   Smoking status: Never   Smokeless tobacco: Never  Vaping Use   Vaping Use: Never used  Substance Use Topics   Alcohol use: No   Drug use: No   Social History   Social History Narrative   One daughter    Left handed    Lives in a one story home  Vital Signs:  BP (!) 153/80    Pulse 90    Ht 5\' 6"  (1.676 m)    Wt 195 lb (88.5 kg)    LMP 02/22/2013    SpO2 93%    BMI 31.47 kg/m    Neurological Exam: MENTAL STATUS including orientation to time, place, person, recent and remote memory, attention span and concentration, language, and fund of knowledge is normal.  Speech is not dysarthric.  CRANIAL NERVES: II:  No visual field defects. III-IV-VI: Pupils equal round and reactive to light.  Normal conjugate, extra-ocular eye movements in all directions of gaze.  No nystagmus.  No ptosis.   V:  Normal facial sensation.    VII:  Normal facial symmetry and movements.   VIII:  Normal hearing and vestibular function.   IX-X:  Normal palatal movement.   XI:  Normal shoulder shrug and  head rotation.   XII:  Normal tongue strength and range of motion, no deviation or fasciculation.  MOTOR: Motor strength is 5/5 throughout, including distally.  No atrophy, fasciculations or abnormal movements.  No pronator drift.   MSRs:  Right        Left                  brachioradialis 2+  2+  biceps 2+  2+  triceps 2+  2+  patellar 2+  2+  ankle jerk 2+  2+  Hoffman no  no  plantar response down  down   SENSORY:  Normal and symmetric perception of light touch, pinprick, vibration, and proprioception.    COORDINATION/GAIT: Normal finger-to- nose-finger.  Intact rapid alternating movements bilaterally.  Gait narrow based and stable, lordotic posture. Tandem and stressed gait intact.    IMPRESSION: Bilateral feet numbness. Exam is normal with preserved distal sensation, strength,and reflexes. She does not have any risk factors for neuropathy. Ddx: ?early neuropathy vs S1 radiculopathy.   - NCS/EMG of the legs to better characterize the nature of her symptoms  Further recommendations pending results.  Thank you for allowing me to participate in patient's care.  If I can answer any additional questions, I would be pleased to do so.    Sincerely,    Rheanne Cortopassi K. 02/24/2013, DO

## 2021-06-23 ENCOUNTER — Other Ambulatory Visit: Payer: Self-pay

## 2021-06-23 ENCOUNTER — Ambulatory Visit (INDEPENDENT_AMBULATORY_CARE_PROVIDER_SITE_OTHER): Payer: 59 | Admitting: Neurology

## 2021-06-23 ENCOUNTER — Encounter: Payer: Self-pay | Admitting: Neurology

## 2021-06-23 VITALS — BP 153/80 | HR 90 | Ht 66.0 in | Wt 195.0 lb

## 2021-06-23 DIAGNOSIS — R200 Anesthesia of skin: Secondary | ICD-10-CM

## 2021-06-23 NOTE — Patient Instructions (Signed)
Nerve testing of the legs.  Do not apply lotion, oil, or moisturizer to your feet or legs on the day of testing.  ELECTROMYOGRAM AND NERVE CONDUCTION STUDIES (EMG/NCS) INSTRUCTIONS  How to Prepare The neurologist conducting the EMG will need to know if you have certain medical conditions. Tell the neurologist and other EMG lab personnel if you: Have a pacemaker or any other electrical medical device Take blood-thinning medications Have hemophilia, a blood-clotting disorder that causes prolonged bleeding Bathing Take a shower or bath shortly before your exam in order to remove oils from your skin. Dont apply lotions or creams before the exam.  What to Expect Youll likely be asked to change into a hospital gown for the procedure and lie down on an examination table. The following explanations can help you understand what will happen during the exam.  Electrodes. The neurologist or a technician places surface electrodes at various locations on your skin depending on where youre experiencing symptoms. Or the neurologist may insert needle electrodes at different sites depending on your symptoms.  Sensations. The electrodes will at times transmit a tiny electrical current that you may feel as a twinge or spasm. The needle electrode may cause discomfort or pain that usually ends shortly after the needle is removed. If you are concerned about discomfort or pain, you may want to talk to the neurologist about taking a short break during the exam.  Instructions. During the needle EMG, the neurologist will assess whether there is any spontaneous electrical activity when the muscle is at rest - activity that isnt present in healthy muscle tissue - and the degree of activity when you slightly contract the muscle.  He or she will give you instructions on resting and contracting a muscle at appropriate times. Depending on what muscles and nerves the neurologist is examining, he or she may ask you to change  positions during the exam.  After your EMG You may experience some temporary, minor bruising where the needle electrode was inserted into your muscle. This bruising should fade within several days. If it persists, contact your primary care doctor.

## 2021-06-30 ENCOUNTER — Other Ambulatory Visit: Payer: Self-pay | Admitting: Family Medicine

## 2021-06-30 DIAGNOSIS — Z1231 Encounter for screening mammogram for malignant neoplasm of breast: Secondary | ICD-10-CM

## 2021-07-10 ENCOUNTER — Telehealth: Payer: Self-pay | Admitting: Family Medicine

## 2021-07-10 NOTE — Telephone Encounter (Signed)
Patient calling in with respiratory symptoms: Shortness of breath, chest pain, palpitations or other red words send to Triage  Does the patient have a fever over 100, cough, congestion, sore throat, runny nose, lost of taste/smell (please list symptoms that patient has)?coughing up yellow/green mucus, congestion in chest x3 days  What date did symptoms start? coughing up mucus, congestion in chest x3 days (If over 5 days ago, pt may be scheduled for in person visit)  Have you tested for Covid in the last 5 days? Yes   If yes, was it positive []  OR negative [x] ? If positive in the last 5 days, please schedule virtual visit now. If negative, schedule for an in person OV with the next available provider if PCP has no openings. Please also let patient know they will be tested again (follow the script below)  "you will have to arrive prior to your appt time to be Covid tested. Please park in back of office at the [facility] & call [phone] to let the staff know you have arrived. A staff member will meet you at your car to do a rapid covid test. Once the test has resulted you will be notified by phone of your results to determine if appt will remain an in person visit or be converted to a virtual/phone visit. If you arrive less than before your appt time, your visit will be automatically converted to virtual & any recommended testing will happen AFTER the visit."   THINGS TO REMEMBER  If no availability for virtual visit in office,  please schedule another [place] office  If no availability at another [place] office, please instruct patient that they can schedule an evisit or virtual visit through their mychart account. Visits up to 8pm  patients can be seen in office 5 days after positive COVID test

## 2021-07-11 ENCOUNTER — Encounter: Payer: Self-pay | Admitting: Family Medicine

## 2021-07-11 ENCOUNTER — Other Ambulatory Visit: Payer: Self-pay

## 2021-07-11 ENCOUNTER — Telehealth (INDEPENDENT_AMBULATORY_CARE_PROVIDER_SITE_OTHER): Payer: 59 | Admitting: Family Medicine

## 2021-07-11 VITALS — Ht 66.0 in

## 2021-07-11 DIAGNOSIS — J069 Acute upper respiratory infection, unspecified: Secondary | ICD-10-CM

## 2021-07-11 DIAGNOSIS — J309 Allergic rhinitis, unspecified: Secondary | ICD-10-CM

## 2021-07-11 DIAGNOSIS — R059 Cough, unspecified: Secondary | ICD-10-CM | POA: Diagnosis not present

## 2021-07-11 MED ORDER — BENZONATATE 100 MG PO CAPS: 200.0000 mg | ORAL_CAPSULE | Freq: Two times a day (BID) | ORAL | 0 refills | Status: AC | PRN

## 2021-07-11 NOTE — Progress Notes (Signed)
Virtual Visit via Video Note I connected with Rachel Love on 07/11/21 by a video enabled telemedicine application and verified that I am speaking with the correct person using two identifiers.  Location patient: home Location provider:work office Persons participating in the virtual visit: patient, provider  I discussed the limitations of evaluation and management by telemedicine and the availability of in person appointments. The patient expressed understanding and agreed to proceed.  Chief Complaint  Patient presents with   Cough    Cough and chest congestion, tried mucinex and coricidin    HPI: [name] is a 61 year old female with history of hyperlipidemia and hypertension complaining of 3 days of productive cough, fatigue,nasal congestion, and rhinorrhea. She has difficulty of bringing the sputum up, it is usually yellowish and brownish. Negative for hemoptysis.  Negative for fever, chills, anosmia,ageusia,sore throat, CP, palpitations, dyspnea, wheezing, abdominal pain, nausea, vomiting, changes in bowel habits, body aches, or skin rash. Home COVID-19 test -2 days ago. Hx of seasonal allergies, she is on Flonase nasal spray daily as needed.  No sick contacts or recent travel. She has been taking Mucinex and Coricidin.  ROS: See pertinent positives and negatives per HPI.  Past Medical History:  Diagnosis Date   Chicken pox    Hyperlipidemia    on meds   Hypertension    on meds   Past Surgical History:  Procedure Laterality Date   FOOT SURGERY Right 1977   FOOT SURGERY Bilateral 2008   THERAPEUTIC ABORTION  1978   UTERINE FIBROID SURGERY  2003   Family History  Problem Relation Age of Onset   Hypertension Mother    Lung cancer Father    Heart disease Maternal Uncle        CAD   Breast cancer Neg Hx    Colon cancer Neg Hx    Colon polyps Neg Hx    Esophageal cancer Neg Hx    Rectal cancer Neg Hx    Stomach cancer Neg Hx     Social History    Socioeconomic History   Marital status: Widowed    Spouse name: Not on file   Number of children: 1   Years of education: Not on file   Highest education level: Not on file  Occupational History   Not on file  Tobacco Use   Smoking status: Never   Smokeless tobacco: Never  Vaping Use   Vaping Use: Never used  Substance and Sexual Activity   Alcohol use: No   Drug use: No   Sexual activity: Yes  Other Topics Concern   Not on file  Social History Narrative   One daughter    Left handed    Lives in a one story home    Social Determinants of Health   Financial Resource Strain: Not on file  Food Insecurity: Not on file  Transportation Needs: Not on file  Physical Activity: Not on file  Stress: Not on file  Social Connections: Not on file  Intimate Partner Violence: Not on file      Current Outpatient Medications:    amLODipine (NORVASC) 10 MG tablet, TAKE 1 TABLET(10 MG) BY MOUTH DAILY, Disp: 90 tablet, Rfl: 3   benzonatate (TESSALON) 100 MG capsule, Take 2 capsules (200 mg total) by mouth 2 (two) times daily as needed for up to 10 days., Disp: 30 capsule, Rfl: 0   CALCIUM PO, Take 600 mg by mouth 2 (two) times daily., Disp: , Rfl:    Cholecalciferol (VITAMIN  D3 PO), Take 1 tablet by mouth daily at 6 (six) AM., Disp: , Rfl:    fluticasone (FLONASE) 50 MCG/ACT nasal spray, Place 1 spray into both nostrils 2 (two) times daily., Disp: 16 g, Rfl: 3   pravastatin (PRAVACHOL) 80 MG tablet, TAKE 1 TABLET(80 MG) BY MOUTH DAILY, Disp: 90 tablet, Rfl: 3  EXAM:  VITALS per patient if applicable:Ht 5\' 6"  (1.676 m)    LMP 02/22/2013    BMI 31.47 kg/m   GENERAL: alert, oriented, appears well and in no acute distress  HEENT: atraumatic, conjunctiva clear, no obvious abnormalities on inspection of external nose and ears  NECK: normal movements of the head and neck  LUNGS: on inspection no signs of respiratory distress, breathing rate appears normal, no obvious gross SOB,  gasping or wheezing  CV: no obvious cyanosis  MS: moves all visible extremities without noticeable abnormality  PSYCH/NEURO: pleasant and cooperative, no obvious depression or anxiety, speech and thought processing grossly intact  ASSESSMENT AND PLAN:  Discussed the following assessment and plan:  URI, acute Mild. Vs allergies. History does not suggest a serious process like pneumonia or bronchitis. Recommend symptomatic treatment. Increase fluid intake, monitor for new symptoms, and continue plain Mucinex. Recommend stopping Coricidin. Monitor for worsening symptoms or new onset of fever.  Cough, unspecified type Explained that cough and congestion can last a few more days and even weeks after acute symptoms have resolved. I do not think imaging is needed at this time but he was offered, she prefers to hold on this.  She will let me know in a week if she is not any better, in which case we can arrange chest x-ray. Benzonatate 100 mg 1 to 2 capsules twice daily as needed recommended for cough.  Allergic rhinitis, unspecified seasonality, unspecified trigger Continue Flonase nasal spray daily as needed. Nasal saline irrigations as needed also recommended.   We discussed possible serious and likely etiologies, options for evaluation and workup, limitations of telemedicine visit vs in person visit, treatment, treatment risks and precautions. The patient was advised to call back or seek an in-person evaluation if the symptoms worsen or if the condition fails to improve as anticipated. I discussed the assessment and treatment plan with the patient. The patient was provided an opportunity to ask questions and all were answered. The patient agreed with the plan and demonstrated an understanding of the instructions.  Return if symptoms worsen or fail to improve.  [name], MD

## 2021-07-20 ENCOUNTER — Telehealth: Payer: Self-pay | Admitting: Family Medicine

## 2021-07-20 DIAGNOSIS — R059 Cough, unspecified: Secondary | ICD-10-CM

## 2021-07-20 NOTE — Telephone Encounter (Signed)
Patient called wanting to schedule an appointment for a chest x ray. She states that she had an appointment with Dr. Swaziland and was told that if her symptoms still persisted, she could have a chest x-ray done.  Informed patient that Dr. Swaziland and her team were out of the office today but a message could be sent back to let them know that she is wanting the x-ray done and that once the orders are placed, Dr. Elvis Coil team will let her know and would call to schedule.   Patient can be reached at [phone].  Please advise.

## 2021-07-21 ENCOUNTER — Ambulatory Visit (INDEPENDENT_AMBULATORY_CARE_PROVIDER_SITE_OTHER): Payer: 59

## 2021-07-21 ENCOUNTER — Other Ambulatory Visit: Payer: Self-pay

## 2021-07-21 ENCOUNTER — Other Ambulatory Visit: Payer: 59

## 2021-07-21 ENCOUNTER — Other Ambulatory Visit: Payer: Self-pay | Admitting: Family Medicine

## 2021-07-21 DIAGNOSIS — I10 Essential (primary) hypertension: Secondary | ICD-10-CM

## 2021-07-21 DIAGNOSIS — R059 Cough, unspecified: Secondary | ICD-10-CM | POA: Diagnosis not present

## 2021-07-21 NOTE — Telephone Encounter (Signed)
Scheduled

## 2021-07-21 NOTE — Telephone Encounter (Signed)
Order is in, can you guys get her scheduled? Thank you!

## 2021-07-25 ENCOUNTER — Other Ambulatory Visit: Payer: Self-pay

## 2021-07-25 ENCOUNTER — Ambulatory Visit: Payer: 59 | Admitting: Neurology

## 2021-07-25 DIAGNOSIS — R200 Anesthesia of skin: Secondary | ICD-10-CM | POA: Diagnosis not present

## 2021-07-25 NOTE — Procedures (Signed)
[facility]  [address] [place], [address]  [place], [place] [address] Tel: [phone] Fax:  [phone] Test Date:  07/25/2021  Patient: Rachel Love DOB: 1960-09-19 Physician: Nita Sickle, DO  Sex: Female Height: 5\' 6"  Ref Phys: , DO  ID#: Nita Sickle   Technician:    Patient Complaints: This is a 61 year old female referred for evaluation of bilateral feet numbness.  NCV & EMG Findings: Electrodiagnostic testing of the right lower extremity and additional studies of the left shows: Bilateral sural and superficial peroneal sensory responses are within normal limits. Bilateral peroneal and tibial motor responses are within normal limits. Bilateral tibial H reflex studies are within normal limits. There is no evidence of active or chronic motor axonal changes affecting any of the tested muscles.  Motor unit configuration and recruitment pattern is within normal limits.  Impression: This is a normal study of the lower extremities.  In particular, there is no evidence of a sensorimotor polyneuropathy or lumbosacral radiculopathy.    ___________________________ 67, DO    Nerve Conduction Studies Anti Sensory Summary Table   Stim Site NR Peak (ms) Norm Peak (ms) P-T Amp (V) Norm P-T Amp  Left Sup Peroneal Anti Sensory (Ant Lat Mall)  32C  12 cm    2.2 <4.6 18.8 >3  Right Sup Peroneal Anti Sensory (Ant Lat Mall)  32C  12 cm    2.0 <4.6 17.4 >3  Left Sural Anti Sensory (Lat Mall)  32C  Calf    3.2 <4.6 12.1 >3  Right Sural Anti Sensory (Lat Mall)  32C  Calf    2.7 <4.6 14.9 >3   Motor Summary Table   Stim Site NR Onset (ms) Norm Onset (ms) O-P Amp (mV) Norm O-P Amp Site1 Site2 Delta-0 (ms) Dist (cm) Vel (m/s) Norm Vel (m/s)  Left Peroneal Motor (Ext Dig Brev)  32C  Ankle    4.0 <6.0 6.6 >2.5 B Fib Ankle 8.4 38.0 45 >40  B Fib    12.4  5.5  Poplt B Fib 1.5 8.0 53 >40  Poplt    13.9  5.3         Right Peroneal Motor (Ext Dig Brev)   32C  Ankle    3.3 <6.0 7.8 >2.5 B Fib Ankle 8.4 38.0 45 >40  B Fib    11.7  7.1  Poplt B Fib 1.6 8.0 50 >40  Poplt    13.3  7.1         Left Tibial Motor (Abd Hall Brev)  32C  Ankle    3.4 <6.0 12.9 >4 Knee Ankle 9.6 41.0 43 >40  Knee    13.0  7.6         Right Tibial Motor (Abd Hall Brev)  32C  Ankle    3.8 <6.0 8.7 >4 Knee Ankle 9.2 43.0 47 >40  Knee    13.0  6.3          H Reflex Studies   NR H-Lat (ms) Lat Norm (ms) L-R H-Lat (ms)  Left Tibial (Gastroc)  32C     34.01 <35 1.09  Right Tibial (Gastroc)  32C     32.93 <35 1.09   EMG   Side Muscle Ins Act Fibs Psw Fasc Number Recrt Dur Dur. Amp Amp. Poly Poly. Comment  Right AntTibialis Nml Nml Nml Nml Nml Nml Nml Nml Nml Nml Nml Nml N/A  Right Gastroc Nml Nml Nml Nml Nml Nml Nml Nml Nml Nml Nml Nml  N/A  Right Flex Dig Long Nml Nml Nml Nml Nml Nml Nml Nml Nml Nml Nml Nml N/A  Right RectFemoris Nml Nml Nml Nml Nml Nml Nml Nml Nml Nml Nml Nml N/A  Right BicepsFemS Nml Nml Nml Nml Nml Nml Nml Nml Nml Nml Nml Nml N/A  Left BicepsFemS Nml Nml Nml Nml Nml Nml Nml Nml Nml Nml Nml Nml N/A  Left AntTibialis Nml Nml Nml Nml Nml Nml Nml Nml Nml Nml Nml Nml N/A  Left Gastroc Nml Nml Nml Nml Nml Nml Nml Nml Nml Nml Nml Nml N/A  Left Flex Dig Long Nml Nml Nml Nml Nml Nml Nml Nml Nml Nml Nml Nml N/A  Left RectFemoris Nml Nml Nml Nml Nml Nml Nml Nml Nml Nml Nml Nml N/A      Waveforms:

## 2021-07-28 ENCOUNTER — Ambulatory Visit
Admission: RE | Admit: 2021-07-28 | Discharge: 2021-07-28 | Disposition: A | Discharge status: Home / Self Care | Payer: 59 | Source: Ambulatory Visit | Attending: Family Medicine | Admitting: Family Medicine

## 2021-07-28 DIAGNOSIS — Z1231 Encounter for screening mammogram for malignant neoplasm of breast: Secondary | ICD-10-CM

## 2021-10-26 ENCOUNTER — Other Ambulatory Visit: Payer: Self-pay | Admitting: Family Medicine

## 2021-10-26 DIAGNOSIS — J309 Allergic rhinitis, unspecified: Secondary | ICD-10-CM

## 2022-01-16 ENCOUNTER — Telehealth: Payer: Self-pay | Admitting: Family Medicine

## 2022-01-16 DIAGNOSIS — I10 Essential (primary) hypertension: Secondary | ICD-10-CM

## 2022-01-16 MED ORDER — AMLODIPINE BESYLATE 10 MG PO TABS: 10.0000 mg | ORAL_TABLET | Freq: Every day | ORAL | 1 refills | Status: DC

## 2022-01-16 NOTE — Telephone Encounter (Signed)
Pt requesting refillof amLODipine (NORVASC) 10 MG tablet  [id number] - [place], [place] - [address] AT [facility] OF [address] & [facility] [address] Phone:  [phone]  Fax:  [phone]

## 2022-02-28 ENCOUNTER — Ambulatory Visit: Payer: 59 | Admitting: Family Medicine

## 2022-02-28 VITALS — BP 158/78 | HR 91 | Temp 98.4°F | Wt 196.8 lb

## 2022-02-28 DIAGNOSIS — R350 Frequency of micturition: Secondary | ICD-10-CM

## 2022-02-28 DIAGNOSIS — B379 Candidiasis, unspecified: Secondary | ICD-10-CM | POA: Diagnosis not present

## 2022-02-28 DIAGNOSIS — T3695XA Adverse effect of unspecified systemic antibiotic, initial encounter: Secondary | ICD-10-CM | POA: Diagnosis not present

## 2022-02-28 LAB — POCT URINALYSIS DIPSTICK
Bilirubin, UA: NEGATIVE
Blood, UA: NEGATIVE
Glucose, UA: NEGATIVE
Leukocytes, UA: NEGATIVE
Nitrite, UA: NEGATIVE
Protein, UA: NEGATIVE
Spec Grav, UA: 1.01 (ref 1.010–1.025)
Urobilinogen, UA: NEGATIVE E.U./dL — AB
pH, UA: 6 (ref 5.0–8.0)

## 2022-02-28 MED ORDER — AMOXICILLIN 500 MG PO TABS: 500.0000 mg | ORAL_TABLET | Freq: Two times a day (BID) | ORAL | 0 refills | Status: AC

## 2022-02-28 MED ORDER — FLUCONAZOLE 150 MG PO TABS: ORAL_TABLET | ORAL | 0 refills | Status: DC

## 2022-02-28 NOTE — Progress Notes (Signed)
Subjective:    Patient ID: Rachel Love, female    DOB: 01/30/1961, 61 y.o.   MRN: [id number]  Chief Complaint  Patient presents with   Urinary Tract Infection    Pressure to use the rr all the time, back was hurting, freq urination, tried D- Mannose     HPI Patient was seen today for acute concern.  Pt endorses urinary freq ,pressure, back pain x 2 wks.  Deneis dysuria, fever, chills, n/v, constipation. Drinking 2 L of water, cranberry juice, Tried D-mannose powder.  Past Medical History:  Diagnosis Date   Chicken pox    Hyperlipidemia    on meds   Hypertension    on meds    No Known Allergies  ROS General: Denies fever, chills, night sweats, changes in weight, changes in appetite HEENT: Denies headaches, ear pain, changes in vision, rhinorrhea, sore throat CV: Denies CP, palpitations, SOB, orthopnea Pulm: Denies SOB, cough, wheezing GI: Denies abdominal pain, nausea, vomiting, diarrhea, constipation GU: Denies dysuria, hematuria, frequency, vaginal discharge  +freq, pressure Msk: Denies muscle cramps, joint pains  +back pain Neuro: Denies weakness, numbness, tingling  Skin: Denies rashes, bruising Psych: Denies depression, anxiety, hallucinations     Objective:    Blood pressure (!) 158/78, pulse 91, temperature 98.4 F (36.9 C), temperature source Oral, weight 196 lb 12.8 oz (89.3 kg), last menstrual period 02/22/2013.  Gen. Pleasant, well-nourished, in no distress, normal affect   HEENT: [place]/AT, face symmetric, conjunctiva clear, no scleral icterus, PERRLA, EOMI, nares patent without drainage, pharynx without erythema or exudate. Neck: No JVD, no thyromegaly, no carotid bruits Lungs: no accessory muscle use, CTAB, no wheezes or rales Cardiovascular: RRR, no m/r/g, no peripheral edema Abdomen: BS present, soft, NT/ND, no hepatosplenomegaly. Musculoskeletal: No deformities, no cyanosis or clubbing, normal tone Neuro:  A&Ox3, CN II-XII intact, normal  gait Skin:  Warm, no lesions/ rash   Wt Readings from Last 3 Encounters:  06/23/21 195 lb (88.5 kg)  04/03/21 187 lb 8 oz (85 kg)  12/12/20 204 lb (92.5 kg)    Lab Results  Component Value Date   WBC 5.2 04/03/2021   HGB 12.1 04/03/2021   HCT 38.0 04/03/2021   PLT 295.0 04/03/2021   GLUCOSE 101 (H) 06/27/2020   CHOL 206 (H) 06/27/2020   TRIG 55.0 06/27/2020   HDL 49.80 06/27/2020   LDLCALC 146 (H) 06/27/2020   ALT 16 06/27/2020   AST 29 06/27/2020   NA 139 06/27/2020   K 5.0 06/27/2020   CL 105 06/27/2020   CREATININE 0.69 06/27/2020   BUN 14 06/27/2020   CO2 26 06/27/2020   TSH 1.85 04/03/2021   HGBA1C 6.1 06/27/2020    Assessment/Plan:  Urinary frequency  -suspected UTI -UA obtained -Ucx -start abx - Plan: amoxicillin (AMOXIL) 500 MG tablet, Culture, Urine, POCT urinalysis dipstick  Antibiotic-induced yeast infection  - Plan: fluconazole (DIFLUCAN) 150 MG tablet  F/u prn  Abbe Amsterdam, MD

## 2022-03-01 LAB — URINE CULTURE
MICRO NUMBER:: 13853254
SPECIMEN QUALITY:: ADEQUATE

## 2022-03-11 ENCOUNTER — Encounter: Payer: Self-pay | Admitting: Family Medicine

## 2022-03-19 ENCOUNTER — Emergency Department (HOSPITAL_COMMUNITY)
Admission: EM | Admit: 2022-03-19 | Discharge: 2022-03-19 | Disposition: A | Discharge status: Home / Self Care | Payer: 59 | Attending: Emergency Medicine | Admitting: Emergency Medicine

## 2022-03-19 ENCOUNTER — Emergency Department (HOSPITAL_COMMUNITY): Payer: 59

## 2022-03-19 ENCOUNTER — Other Ambulatory Visit: Payer: Self-pay

## 2022-03-19 ENCOUNTER — Encounter (HOSPITAL_COMMUNITY): Payer: Self-pay | Admitting: Emergency Medicine

## 2022-03-19 DIAGNOSIS — D649 Anemia, unspecified: Secondary | ICD-10-CM | POA: Diagnosis not present

## 2022-03-19 DIAGNOSIS — R072 Precordial pain: Secondary | ICD-10-CM

## 2022-03-19 DIAGNOSIS — Z79899 Other long term (current) drug therapy: Secondary | ICD-10-CM | POA: Insufficient documentation

## 2022-03-19 DIAGNOSIS — I10 Essential (primary) hypertension: Secondary | ICD-10-CM | POA: Diagnosis not present

## 2022-03-19 LAB — CBC
HCT: 36.8 % (ref 36.0–46.0)
Hemoglobin: 11.9 g/dL — ABNORMAL LOW (ref 12.0–15.0)
MCH: 29.2 pg (ref 26.0–34.0)
MCHC: 32.3 g/dL (ref 30.0–36.0)
MCV: 90.2 fL (ref 80.0–100.0)
Platelets: 253 10*3/uL (ref 150–400)
RBC: 4.08 MIL/uL (ref 3.87–5.11)
RDW: 14.1 % (ref 11.5–15.5)
WBC: 5.9 10*3/uL (ref 4.0–10.5)
nRBC: 0 % (ref 0.0–0.2)

## 2022-03-19 LAB — BASIC METABOLIC PANEL
Anion gap: 13 (ref 5–15)
BUN: 18 mg/dL (ref 6–20)
CO2: 19 mmol/L — ABNORMAL LOW (ref 22–32)
Calcium: 9.3 mg/dL (ref 8.9–10.3)
Chloride: 106 mmol/L (ref 98–111)
Creatinine, Ser: 0.9 mg/dL (ref 0.44–1.00)
GFR, Estimated: >60 mL/min (ref >60)
Glucose, Bld: 182 mg/dL — ABNORMAL HIGH (ref 70–99)
Potassium: 3 mmol/L — ABNORMAL LOW (ref 3.5–5.1)
Sodium: 138 mmol/L (ref 135–145)

## 2022-03-19 LAB — I-STAT BETA HCG BLOOD, ED (MC, WL, AP ONLY): I-stat hCG, quantitative: <5 m[IU]/mL (ref <5)

## 2022-03-19 LAB — TROPONIN I (HIGH SENSITIVITY)
Troponin I (High Sensitivity): 3 ng/L (ref <18)
Troponin I (High Sensitivity): 4 ng/L (ref <18)

## 2022-03-19 NOTE — ED Provider Notes (Signed)
[facility] EMERGENCY DEPARTMENT Provider Note  CSN: [id number] Arrival date & time: 03/19/22 3875  Chief Complaint(s) Chest Pain  HPI Rachel Love is a 61 y.o. female     Chest Pain Pain location:  Substernal area Pain quality: pressure   Pain radiates to:  Does not radiate Pain severity:  Moderate Onset quality:  Sudden Duration:  4 hours Timing:  Constant Progression:  Resolved Chronicity:  New Relieved by: sitting up. Exacerbated by: lying down. Ineffective treatments:  Antacids Associated symptoms: no cough, no fever, no lower extremity edema, no nausea, no shortness of breath and no vomiting   Risk factors: high cholesterol, hypertension and obesity   Risk factors: no smoking     Past Medical History Past Medical History:  Diagnosis Date   Chicken pox    Hyperlipidemia    on meds   Hypertension    on meds   Patient Active Problem List   Diagnosis Date Noted   Prediabetes 06/16/2018   Fatty liver 01/27/2018   Vaginal atrophy 05/27/2017   Allergic rhinitis 03/26/2017   Hypertension, essential, benign 10/23/2016   Vitamin D deficiency 10/23/2016   Hyperlipidemia 10/23/2016   Class 1 obesity with body mass index (BMI) of 34.0 to 34.9 in adult 10/23/2016   Generalized osteoarthritis of multiple sites 10/23/2016   Home Medication(s) Prior to Admission medications   Medication Sig Start Date End Date Taking? Authorizing Provider  amLODipine (NORVASC) 10 MG tablet Take 1 tablet (10 mg total) by mouth daily. 01/16/22  Yes Swaziland, Betty G, MD  CALCIUM PO Take 600 mg by mouth 2 (two) times daily.   Yes [provider]  Cholecalciferol (VITAMIN D3 PO) Take 1 tablet by mouth daily at 6 (six) AM.   Yes [provider]  fluticasone (FLONASE) 50 MCG/ACT nasal spray SHAKE LIQUID AND USE 1 SPRAY IN EACH NOSTRIL TWICE DAILY Patient taking differently: Place 1 spray into both nostrils daily as needed for allergies. 10/27/21  Yes  [place], Betty G, MD  pravastatin (PRAVACHOL) 80 MG tablet TAKE 1 TABLET(80 MG) BY MOUTH DAILY Patient not taking: Reported on 03/19/2022 08/08/20   [place]il[place], Betty G, MD                                                                                                                                    Allergies Patient has no known allergies.  Review of Systems Review of Systems  Constitutional:  Negative for fever.  Respiratory:  Negative for cough and shortness of breath.   Cardiovascular:  Positive for chest pain.  Gastrointestinal:  Negative for nausea and vomiting.   As noted in HPI  Physical Exam Vital Signs  I have reviewed the triage vital signs BP (!) 144/74   Pulse 77   Temp 98.2 F (36.8 C)   Resp 18   Ht 5\' 6"  (1.676 m)   Wt 86.2 kg  LMP 02/22/2013   SpO2 100%   BMI 30.67 kg/m   Physical Exam Vitals reviewed.  Constitutional:      General: She is not in acute distress.    Appearance: She is well-developed. She is not diaphoretic.  HENT:     Head: Normocephalic and atraumatic.     Nose: Nose normal.  Eyes:     General: No scleral icterus.       Right eye: No discharge.        Left eye: No discharge.     Conjunctiva/sclera: Conjunctivae normal.     Pupils: Pupils are equal, round, and reactive to light.  Cardiovascular:     Rate and Rhythm: Normal rate and regular rhythm.     Heart sounds: No murmur heard.    No friction rub. No gallop.  Pulmonary:     Effort: Pulmonary effort is normal. No respiratory distress.     Breath sounds: Normal breath sounds. No stridor. No rales.  Abdominal:     General: There is no distension.     Palpations: Abdomen is soft.     Tenderness: There is no abdominal tenderness.  Musculoskeletal:        General: No tenderness.     Cervical back: Normal range of motion and neck supple.  Skin:    General: Skin is warm and dry.     Findings: No erythema or rash.  Neurological:     Mental Status: She is alert and oriented to  person, place, and time.     ED Results and Treatments Labs (all labs ordered are listed, but only abnormal results are displayed) Labs Reviewed  BASIC METABOLIC PANEL - Abnormal; Notable for the following components:      Result Value   Potassium 3.0 (*)    CO2 19 (*)    Glucose, Bld 182 (*)    All other components within normal limits  CBC - Abnormal; Notable for the following components:   Hemoglobin 11.9 (*)    All other components within normal limits  I-STAT BETA HCG BLOOD, ED ([facility], [facility], [facility] ONLY)  TROPONIN I (HIGH SENSITIVITY)  TROPONIN I (HIGH SENSITIVITY)                                                                                                                         EKG  EKG Interpretation  Date/Time:  Monday March 19 2022 04:34:49 EDT Ventricular Rate:  85 PR Interval:  154 QRS Duration: 88 QT Interval:  398 QTC Calculation: 473 R Axis:   -60 Text Interpretation: Normal sinus rhythm Left anterior fascicular block When compared with ECG of 11-Feb-2003 06:34, PREVIOUS ECG IS PRESENT Confirmed by Addison Lank [phone]) on 03/19/2022 5:35:45 AM       Radiology DG Chest 2 View  Result Date: 03/19/2022 CLINICAL DATA:  61 year old female with history of chest pain and weakness. EXAM: CHEST - 2 VIEW COMPARISON:  Chest x-ray 07/21/2021. FINDINGS: Lung volumes are normal. No consolidative  airspace disease. No pleural effusions. No pneumothorax. No pulmonary nodule or mass noted. Pulmonary vasculature and the cardiomediastinal silhouette are within normal limits. IMPRESSION: No radiographic evidence of acute cardiopulmonary disease. Electronically Signed   By: Vinnie Langton M.D.   On: 03/19/2022 05:12    Medications Ordered in ED Medications - No data to display                                                                                                                                   Procedures Procedures  (including critical care time)  Medical  Decision Making / ED Course   Medical Decision Making Amount and/or Complexity of Data Reviewed Labs: ordered. Decision-making details documented in ED Course. Radiology: ordered and independent interpretation performed. Decision-making details documented in ED Course. ECG/medicine tests: ordered and independent interpretation performed. Decision-making details documented in ED Course.    Atypical chest pain now resolved. Features are more suspicious for GI related process.  We will need to rule out ACS.  Heart score less than 3. EKG without acute ischemic changes or evidence of pericarditis. Initial troponin negative.Marland Kitchen  Appropriate for delta troponin.  Presentation not concerning for aortic dissection or esophageal perforation. Low concern for PE.  Chest x-ray without evidence of pneumonia or pneumothorax.  CBC without leukocytosis.  Mild anemia. No significant electrolyte derangements or renal sufficiency.  Patient does have hyperglycemia but this is likely due to her taken cough medicine.  No history of diabetes with normal glucose on previous labs.      Final Clinical Impression(s) / ED Diagnoses Final diagnoses:  Precordial pain           This chart was dictated using voice recognition software.  Despite best efforts to proofread,  errors can occur which can change the documentation meaning.    Fatima Blank, MD 03/19/22 [phone]

## 2022-03-19 NOTE — ED Triage Notes (Signed)
Pt reports she had substernal chest heaviness that woke her up out of sleep.  Pt denies any other symptoms other than the heaviness. Denies hx

## 2022-03-19 NOTE — ED Provider Notes (Signed)
  Physical Exam  BP (!) 142/72   Pulse 79   Temp 98.2 F (36.8 C)   Resp 19   Ht 5\' 6"  (1.676 m)   Wt 86.2 kg   LMP 02/22/2013   SpO2 98%   BMI 30.67 kg/m   Physical Exam  Procedures  Procedures  ED Course / MDM    Medical Decision Making Amount and/or Complexity of Data Reviewed Labs: ordered. Radiology: ordered.  Received patient in signout.  Chest pain.  Overall low risk.  Heart score of 2.  However pending second troponin.  Second troponin is resulted as negative.  Informed patient.  Will discharge home with outpatient follow-up as needed.       Davonna Belling, MD 03/19/22 [phone]

## 2022-04-02 ENCOUNTER — Encounter: Payer: Self-pay | Admitting: Family Medicine

## 2022-04-02 ENCOUNTER — Ambulatory Visit (INDEPENDENT_AMBULATORY_CARE_PROVIDER_SITE_OTHER): Payer: 59 | Admitting: Family Medicine

## 2022-04-02 VITALS — BP 120/76 | HR 99 | Temp 98.3°F | Resp 12 | Ht 66.0 in | Wt 183.2 lb

## 2022-04-02 DIAGNOSIS — Z13228 Encounter for screening for other metabolic disorders: Secondary | ICD-10-CM | POA: Diagnosis not present

## 2022-04-02 DIAGNOSIS — Z0000 Encounter for general adult medical examination without abnormal findings: Secondary | ICD-10-CM

## 2022-04-02 DIAGNOSIS — E663 Overweight: Secondary | ICD-10-CM | POA: Diagnosis not present

## 2022-04-02 DIAGNOSIS — E785 Hyperlipidemia, unspecified: Secondary | ICD-10-CM

## 2022-04-02 DIAGNOSIS — Z1329 Encounter for screening for other suspected endocrine disorder: Secondary | ICD-10-CM | POA: Diagnosis not present

## 2022-04-02 DIAGNOSIS — E876 Hypokalemia: Secondary | ICD-10-CM | POA: Diagnosis not present

## 2022-04-02 DIAGNOSIS — Z23 Encounter for immunization: Secondary | ICD-10-CM

## 2022-04-02 DIAGNOSIS — E559 Vitamin D deficiency, unspecified: Secondary | ICD-10-CM | POA: Diagnosis not present

## 2022-04-02 DIAGNOSIS — Z130 Encounter for screening for diseases of the blood and blood-forming organs and certain disorders involving the immune mechanism: Secondary | ICD-10-CM | POA: Diagnosis not present

## 2022-04-02 DIAGNOSIS — I10 Essential (primary) hypertension: Secondary | ICD-10-CM

## 2022-04-02 LAB — COMPREHENSIVE METABOLIC PANEL WITH GFR
ALT: 24 U/L (ref 0–35)
AST: 31 U/L (ref 0–37)
Albumin: 4.1 g/dL (ref 3.5–5.2)
Alkaline Phosphatase: 78 U/L (ref 39–117)
BUN: 16 mg/dL (ref 6–23)
CO2: 20 meq/L (ref 19–32)
Calcium: 9.8 mg/dL (ref 8.4–10.5)
Chloride: 99 meq/L (ref 96–112)
Creatinine, Ser: 0.95 mg/dL (ref 0.40–1.20)
GFR: 64.95 mL/min
Glucose, Bld: 85 mg/dL (ref 70–99)
Potassium: 3.7 meq/L (ref 3.5–5.1)
Sodium: 134 meq/L — ABNORMAL LOW (ref 135–145)
Total Bilirubin: 0.6 mg/dL (ref 0.2–1.2)
Total Protein: 7.8 g/dL (ref 6.0–8.3)

## 2022-04-02 LAB — VITAMIN D 25 HYDROXY (VIT D DEFICIENCY, FRACTURES): VITD: 64.9 ng/mL (ref 30.00–100.00)

## 2022-04-02 LAB — LIPID PANEL
Cholesterol: 227 mg/dL — ABNORMAL HIGH (ref 0–200)
HDL: 45.9 mg/dL
LDL Cholesterol: 168 mg/dL — ABNORMAL HIGH (ref 0–99)
NonHDL: 181.03
Total CHOL/HDL Ratio: 5
Triglycerides: 63 mg/dL (ref 0.0–149.0)
VLDL: 12.6 mg/dL (ref 0.0–40.0)

## 2022-04-02 LAB — HEMOGLOBIN A1C: Hgb A1c MFr Bld: 5.9 % (ref 4.6–6.5)

## 2022-04-02 NOTE — Progress Notes (Signed)
HPI: Rachel Love is a 61 y.o. female, who is here today for her routine physical.  Last CPE: 06/21/2020  She is exercising regularly, walking for an hours daily. She is also eating healthier, has lost some wt. She feels great, more energetic. She is avoiding process food, decreased sugar and fruit intake, eating organic foods and plenty of vegetable. She is sleeping better.  Chronic medical problems: Vitamin D deficiency, generalized OA, hyperlipidemia, prediabetes, and allergy rhinitis among some.  Immunization History  Administered Date(s) Administered   Influenza,inj,Quad PF,6+ Mos 05/27/2017, 06/16/2018, 06/22/2019, 04/02/2022   Influenza-Unspecified 04/14/2020, 04/26/2021   PFIZER(Purple Top)SARS-COV-2 Vaccination 09/05/2019, 10/03/2019, 05/21/2020   Pfizer Covid-19 Vaccine Bivalent Booster 1yrs & up 04/26/2021   Tdap 06/22/2019   Zoster Recombinat (Shingrix) 06/22/2020, 04/02/2022   Health Maintenance  Topic Date Due   HIV Screening  06/21/2024 (Originally 05/12/1976)   PAP SMEAR-Modifier  06/23/2023   MAMMOGRAM  07/29/2023   TETANUS/TDAP  06/21/2029   COLONOSCOPY (Pts 45-23yrs Insurance coverage will need to be confirmed)  12/13/2030   INFLUENZA VACCINE  Completed   COVID-19 Vaccine  Completed   Hepatitis C Screening  Completed   Zoster Vaccines- Shingrix  Completed   HPV VACCINES  Aged Out   High risk HPV Negative   Adequacy Satisfactory for evaluation; transformation zone component PRESENT.   Diagnosis - Negative for intraepithelial lesion or malignancy (NILM)   Comment Normal Reference Range HPV - Negative   Resulting Agency [facility]   Since her last visit she has seen neurology to follow on feet numbness.Problem has improved.  Evaluated in the ED 03/19/22 for CP. Pain was caused by GERD, she has not had any since ED visit.  HTN:She would like to try stopping her Amlodipine, she is on 10 mg daily. BP has improved, Low 100/s/70  since she  changed her diet.  Lab Results  Component Value Date   CREATININE 0.90 03/19/2022   BUN 18 03/19/2022   NA 138 03/19/2022   K 3.0 (L) 03/19/2022   CL 106 03/19/2022   CO2 19 (L) 03/19/2022  Troponin in normal range x2. Lab Results  Component Value Date   WBC 5.9 03/19/2022   HGB 11.9 (L) 03/19/2022   HCT 36.8 03/19/2022   MCV 90.2 03/19/2022   PLT 253 03/19/2022   CXR:No radiographic evidence of acute cardiopulmonary disease.  Prediabetes: Glucose in the ED was 182. Negative for polydipsia,polyuria, or polyphagia. Lab Results  Component Value Date   HGBA1C 6.1 06/27/2020   HLD: She is on nonpharmacologic treatment. Lab Results  Component Value Date   CHOL 206 (H) 06/27/2020   HDL 49.80 06/27/2020   LDLCALC 146 (H) 06/27/2020   TRIG 55.0 06/27/2020   CHOLHDL 4 06/27/2020   Vit D deficiency: She is on Vit D 1200 U daily. Last 25 OH vit D was 35 in 09/2016.  Review of Systems  Constitutional:  Negative for activity change, appetite change, chills, fatigue and fever.  HENT:  Negative for hearing loss, mouth sores, sore throat and trouble swallowing.   Eyes:  Negative for redness and visual disturbance.  Respiratory:  Negative for cough, shortness of breath and wheezing.   Cardiovascular:  Negative for chest pain and leg swelling.  Gastrointestinal:  Negative for abdominal pain, nausea and vomiting.       No changes in bowel habits.  Endocrine: Negative for cold intolerance and heat intolerance.  Genitourinary:  Negative for decreased urine volume, dysuria, hematuria, vaginal bleeding and  vaginal discharge.  Musculoskeletal:  Negative for gait problem and myalgias.  Skin:  Negative for color change and rash.  Allergic/Immunologic: Positive for environmental allergies.  Neurological:  Negative for seizures, syncope, weakness and headaches.  Hematological:  Negative for adenopathy. Does not bruise/bleed easily.  Psychiatric/Behavioral:  Negative for confusion and sleep  disturbance.   All other systems reviewed and are negative.  Current Outpatient Medications on File Prior to Visit  Medication Sig Dispense Refill   amLODipine (NORVASC) 10 MG tablet Take 1 tablet (10 mg total) by mouth daily. 90 tablet 1   CALCIUM PO Take 600 mg by mouth 2 (two) times daily.     Cholecalciferol (VITAMIN D3 PO) Take 1 tablet by mouth daily at 6 (six) AM.     fluticasone (FLONASE) 50 MCG/ACT nasal spray SHAKE LIQUID AND USE 1 SPRAY IN EACH NOSTRIL TWICE DAILY (Patient taking differently: Place 1 spray into both nostrils daily as needed for allergies.) 16 g 3   pravastatin (PRAVACHOL) 80 MG tablet TAKE 1 TABLET(80 MG) BY MOUTH DAILY 90 tablet 3   No current facility-administered medications on file prior to visit.   Past Medical History:  Diagnosis Date   Chicken pox    Hyperlipidemia    on meds   Hypertension    on meds   Past Surgical History:  Procedure Laterality Date   FOOT SURGERY Right 1977   FOOT SURGERY Bilateral 2008   THERAPEUTIC ABORTION  1978   UTERINE FIBROID SURGERY  2003   No Known Allergies  Family History  Problem Relation Age of Onset   Hypertension Mother    Lung cancer Father    Heart disease Maternal Uncle        CAD   Breast cancer Neg Hx    Colon cancer Neg Hx    Colon polyps Neg Hx    Esophageal cancer Neg Hx    Rectal cancer Neg Hx    Stomach cancer Neg Hx     Social History   Socioeconomic History   Marital status: Widowed    Spouse name: Not on file   Number of children: 1   Years of education: Not on file   Highest education level: Not on file  Occupational History   Not on file  Tobacco Use   Smoking status: Never   Smokeless tobacco: Never  Vaping Use   Vaping Use: Never used  Substance and Sexual Activity   Alcohol use: No   Drug use: No   Sexual activity: Yes  Other Topics Concern   Not on file  Social History Narrative   One daughter    Left handed    Lives in a one story home    Social Determinants  of Health   Financial Resource Strain: Not on file  Food Insecurity: Not on file  Transportation Needs: Not on file  Physical Activity: Not on file  Stress: Not on file  Social Connections: Not on file   Vitals:   04/02/22 0728  BP: 120/76  Pulse: 99  Resp: 12  Temp: 98.3 F (36.8 C)  SpO2: 99%  Body mass index is 29.58 kg/m. Wt Readings from Last 3 Encounters:  04/02/22 183 lb 4 oz (83.1 kg)  03/19/22 190 lb (86.2 kg)  02/28/22 196 lb 12.8 oz (89.3 kg)   Physical Exam Vitals and nursing note reviewed.  Constitutional:      General: She is not in acute distress.    Appearance: She is well-developed and  well-groomed.  HENT:     Head: Normocephalic and atraumatic.     Right Ear: Hearing, tympanic membrane, ear canal and external ear normal.     Left Ear: Hearing, tympanic membrane, ear canal and external ear normal.     Mouth/Throat:     Mouth: Mucous membranes are moist.     Pharynx: Oropharynx is clear. Uvula midline.  Eyes:     Extraocular Movements: Extraocular movements intact.     Conjunctiva/sclera: Conjunctivae normal.     Pupils: Pupils are equal, round, and reactive to light.  Neck:     Thyroid: No thyromegaly.     Trachea: No tracheal deviation.  Cardiovascular:     Rate and Rhythm: Normal rate and regular rhythm.     Pulses:          Dorsalis pedis pulses are 2+ on the right side and 2+ on the left side.     Heart sounds: No murmur heard. Pulmonary:     Effort: Pulmonary effort is normal. No respiratory distress.     Breath sounds: Normal breath sounds.  Chest:     Chest wall: No tenderness.  Breasts:    Right: No swelling, inverted nipple, mass, nipple discharge, skin change or tenderness.     Left: No swelling, inverted nipple, mass, nipple discharge, skin change or tenderness.     Comments: Declined chaperone. Abdominal:     Palpations: Abdomen is soft. There is no hepatomegaly or mass.     Tenderness: There is no abdominal tenderness.   Genitourinary:    Comments: No concerns. Musculoskeletal:     Right lower leg: No edema.     Left lower leg: No edema.     Comments: No major deformity or signs of synovitis appreciated.  Lymphadenopathy:     Cervical: No cervical adenopathy.     Upper Body:     Right upper body: No supraclavicular or axillary adenopathy.     Left upper body: No supraclavicular or axillary adenopathy.  Skin:    General: Skin is warm.     Findings: No erythema or rash.  Neurological:     General: No focal deficit present.     Mental Status: She is alert and oriented to person, place, and time.     Cranial Nerves: No cranial nerve deficit.     Coordination: Coordination normal.     Gait: Gait normal.     Deep Tendon Reflexes:     Reflex Scores:      Bicep reflexes are 2+ on the right side and 2+ on the left side.      Patellar reflexes are 2+ on the right side and 2+ on the left side. Psychiatric:        Mood and Affect: Mood and affect normal.   ASSESSMENT AND PLAN:  Ms. Tamiko Leopard was here today annual physical examination.  Orders Placed This Encounter  Procedures   Flu Vaccine QUAD 24mo+IM (Fluarix, Fluzone & Alfiuria Quad PF)   Varicella-zoster vaccine IM   Comprehensive metabolic panel   Hemoglobin A1c   Lipid panel   VITAMIN D 25 Hydroxy (Vit-D Deficiency, Fractures)   Lab Results  Component Value Date   CREATININE 0.95 04/02/2022   BUN 16 04/02/2022   NA 134 (L) 04/02/2022   K 3.7 04/02/2022   CL 99 04/02/2022   CO2 20 04/02/2022   Lab Results  Component Value Date   ALT 24 04/02/2022   AST 31 04/02/2022   ALKPHOS  78 04/02/2022   BILITOT 0.6 04/02/2022   Lab Results  Component Value Date   CHOL 227 (H) 04/02/2022   HDL 45.90 04/02/2022   LDLCALC 168 (H) 04/02/2022   TRIG 63.0 04/02/2022   CHOLHDL 5 04/02/2022   Lab Results  Component Value Date   HGBA1C 5.9 04/02/2022   Routine general medical examination at a health care facility We discussed the  importance of regular physical activity and healthy diet for prevention of chronic illness and/or complications. Preventive guidelines reviewed. Vaccination updated. Pap smear up to date, due in 2025. Ca++ and vit D supplementation to continue. Next CPE in a year.  The 10-year ASCVD risk score (Arnett DK, et al., 2019) is: 7.1%   Values used to calculate the score:     Age: 56 years     Sex: Female     Is Non-Hispanic African American: Yes     Diabetic: No     Tobacco smoker: No     Systolic Blood Pressure: 120 mmHg     Is BP treated: Yes     HDL Cholesterol: 45.9 mg/dL     Total Cholesterol: 227 mg/dL  Hypokalemia Continue potassium rich diet. Further recommendation will be given according to BMP result.  Screening for endocrine, metabolic and immunity disorder -     Hemoglobin A1c  Need for influenza vaccination -     Flu Vaccine QUAD 41mo+IM (Fluarix, Fluzone & Alfiuria Quad PF)  Need for shingles vaccine -     Varicella-zoster vaccine IM  Overweight (BMI 25.0-29.9) She has lost a few pounds since 01/2022, BMI is now under 30. She understands the benefits of wt loss as well as adverse effects of obesity. Consistency with healthy diet and physical activity encouraged.  Hypertension, essential, benign BP adequately controlled, Home BP readings even better. She would like to decrease Amlodipine dose, so she will continue 5 mg instead 10 mg (1/2 tab). Continue monitoring BP regularly and following low-salt diet. She was instructed to let me know about BP readings in 3 to 4 weeks, if BP still adequately controlled, will send new prescription for amlodipine 5 mg. Eye exam is current.  Vitamin D deficiency, unspecified Continue vitamin D supplementation 1200 units daily. Further recommendation will be given according to 25 OH vitamin D result.  Hyperlipidemia Non pharmacologic treatment recommended for now. Further recommendations will be given according to 10 years CVD  risk score and lipid panel numbers.  Return in 1 year (on 04/03/2023) for CPE and f/u.  Aireana Ryland G. [place], MD  [facility]. [place] office.

## 2022-04-02 NOTE — Assessment & Plan Note (Addendum)
She has lost a few pounds since 01/2022, BMI is now under 30. She understands the benefits of wt loss as well as adverse effects of obesity. Consistency with healthy diet and physical activity encouraged.

## 2022-04-02 NOTE — Patient Instructions (Addendum)
A few things to remember from today's visit:  Routine general medical examination at a health care facility  Hyperlipidemia, unspecified hyperlipidemia type - Plan: Comprehensive metabolic panel, Lipid panel  Hypokalemia - Plan: Comprehensive metabolic panel  Vitamin D deficiency - Plan: VITAMIN D 25 Hydroxy (Vit-D Deficiency, Fractures)  Screening for endocrine, metabolic and immunity disorder - Plan: Hemoglobin A1c  You can try decreasing Amlodipine from 10 mg to 5 mg (1/2 tab) and continue monitoring blood pressure. Goal is under 140/90. If blood pressure starts going up again, go back to Amlodipine 10 mg daily.  Please send me a message about blood pressure readings in 3-4 weeks, so I can send a new prescription. Continue the good work!  Health Maintenance, Female Adopting a healthy lifestyle and getting preventive care are important in promoting health and wellness. Ask your health care provider about: The right schedule for you to have regular tests and exams. Things you can do on your own to prevent diseases and keep yourself healthy. What should I know about diet, weight, and exercise? Eat a healthy diet  Eat a diet that includes plenty of vegetables, fruits, low-fat dairy products, and lean protein. Do not eat a lot of foods that are high in solid fats, added sugars, or sodium. Maintain a healthy weight Body mass index (BMI) is used to identify weight problems. It estimates body fat based on height and weight. Your health care provider can help determine your BMI and help you achieve or maintain a healthy weight. Get regular exercise Get regular exercise. This is one of the most important things you can do for your health. Most adults should: Exercise for at least 150 minutes each week. The exercise should increase your heart rate and make you sweat (moderate-intensity exercise). Do strengthening exercises at least twice a week. This is in addition to the moderate-intensity  exercise. Spend less time sitting. Even light physical activity can be beneficial. Watch cholesterol and blood lipids Have your blood tested for lipids and cholesterol at 61 years of age, then have this test every 5 years. Have your cholesterol levels checked more often if: Your lipid or cholesterol levels are high. You are older than 60 years of age. You are at high risk for heart disease. What should I know about cancer screening? Depending on your health history and family history, you may need to have cancer screening at various ages. This may include screening for: Breast cancer. Cervical cancer. Colorectal cancer. Skin cancer. Lung cancer. What should I know about heart disease, diabetes, and high blood pressure? Blood pressure and heart disease High blood pressure causes heart disease and increases the risk of stroke. This is more likely to develop in people who have high blood pressure readings or are overweight. Have your blood pressure checked: Every 3-5 years if you are 39-17 years of age. Every year if you are 31 years old or older. Diabetes Have regular diabetes screenings. This checks your fasting blood sugar level. Have the screening done: Once every three years after age 37 if you are at a normal weight and have a low risk for diabetes. More often and at a younger age if you are overweight or have a high risk for diabetes. What should I know about preventing infection? Hepatitis B If you have a higher risk for hepatitis B, you should be screened for this virus. Talk with your health care provider to find out if you are at risk for hepatitis B infection. Hepatitis C Testing  is recommended for: Everyone born from [birth year] through 1965. Anyone with known risk factors for hepatitis C. Sexually transmitted infections (STIs) Get screened for STIs, including gonorrhea and chlamydia, if: You are sexually active and are younger than 61 years of age. You are older than 61 years  of age and your health care provider tells you that you are at risk for this type of infection. Your sexual activity has changed since you were last screened, and you are at increased risk for chlamydia or gonorrhea. Ask your health care provider if you are at risk. Ask your health care provider about whether you are at high risk for HIV. Your health care provider may recommend a prescription medicine to help prevent HIV infection. If you choose to take medicine to prevent HIV, you should first get tested for HIV. You should then be tested every 3 months for as long as you are taking the medicine. Pregnancy If you are about to stop having your period (premenopausal) and you may become pregnant, seek counseling before you get pregnant. Take 400 to 800 micrograms (mcg) of folic acid every day if you become pregnant. Ask for birth control (contraception) if you want to prevent pregnancy. Osteoporosis and menopause Osteoporosis is a disease in which the bones lose minerals and strength with aging. This can result in bone fractures. If you are 68 years old or older, or if you are at risk for osteoporosis and fractures, ask your health care provider if you should: Be screened for bone loss. Take a calcium or vitamin D supplement to lower your risk of fractures. Be given hormone replacement therapy (HRT) to treat symptoms of menopause. Follow these instructions at home: Alcohol use Do not drink alcohol if: Your health care provider tells you not to drink. You are pregnant, may be pregnant, or are planning to become pregnant. If you drink alcohol: Limit how much you have to: 0-1 drink a day. Know how much alcohol is in your drink. In the [place] one drink equals one 12 oz bottle of beer (355 mL), one 5 oz glass of wine (148 mL), or one 1 oz glass of hard liquor (44 mL). Lifestyle Do not use any products that contain nicotine or tobacco. These products include cigarettes, chewing tobacco, and vaping  devices, such as e-cigarettes. If you need help quitting, ask your health care provider. Do not use street drugs. Do not share needles. Ask your health care provider for help if you need support or information about quitting drugs. General instructions Schedule regular health, dental, and eye exams. Stay current with your vaccines. Tell your health care provider if: You often feel depressed. You have ever been abused or do not feel safe at home. Summary Adopting a healthy lifestyle and getting preventive care are important in promoting health and wellness. Follow your health care provider's instructions about healthy diet, exercising, and getting tested or screened for diseases. Follow your health care provider's instructions on monitoring your cholesterol and blood pressure. This information is not intended to replace advice given to you by your health care provider. Make sure you discuss any questions you have with your health care provider. Document Revised: 11/07/2020 Document Reviewed: 11/07/2020 Elsevier Patient Education  2023 [employer].  If you need refills for medications you take chronically, please call your pharmacy. Do not use My Chart to request refills or for acute issues that need immediate attention. If you send a my chart message, it may take a few days to be  addressed, specially if I am not in the office.  Please be sure medication list is accurate. If a new problem present, please set up appointment sooner than planned today.

## 2022-04-02 NOTE — Assessment & Plan Note (Addendum)
BP adequately controlled, Home BP readings even better. She would like to decrease Amlodipine dose, so she will continue 5 mg instead 10 mg (1/2 tab). Continue monitoring BP regularly and following low-salt diet. She was instructed to let me know about BP readings in 3 to 4 weeks, if BP still adequately controlled, will send new prescription for amlodipine 5 mg. Eye exam is current.

## 2022-04-02 NOTE — Assessment & Plan Note (Signed)
Non pharmacologic treatment recommended for now. Further recommendations will be given according to 10 years CVD risk score and lipid panel numbers. 

## 2022-04-02 NOTE — Assessment & Plan Note (Signed)
Continue vitamin D supplementation 1200 units daily. Further recommendation will be given according to 25 OH vitamin D result.

## 2022-04-03 ENCOUNTER — Other Ambulatory Visit: Payer: Self-pay | Admitting: Family Medicine

## 2022-04-03 DIAGNOSIS — E785 Hyperlipidemia, unspecified: Secondary | ICD-10-CM

## 2022-04-03 MED ORDER — ROSUVASTATIN CALCIUM 10 MG PO TABS: 10.0000 mg | ORAL_TABLET | Freq: Every day | ORAL | 3 refills | Status: DC

## 2022-06-12 ENCOUNTER — Other Ambulatory Visit: Payer: Self-pay | Admitting: Family Medicine

## 2022-06-12 DIAGNOSIS — Z1231 Encounter for screening mammogram for malignant neoplasm of breast: Secondary | ICD-10-CM

## 2022-07-10 IMAGING — DX DG CHEST 2V
2 series · 2 of 2 positions shown · non-contrast
Comparison: 12/03/2017

CLINICAL DATA: Cough

EXAM:
CHEST - 2 VIEW

[chest pa]
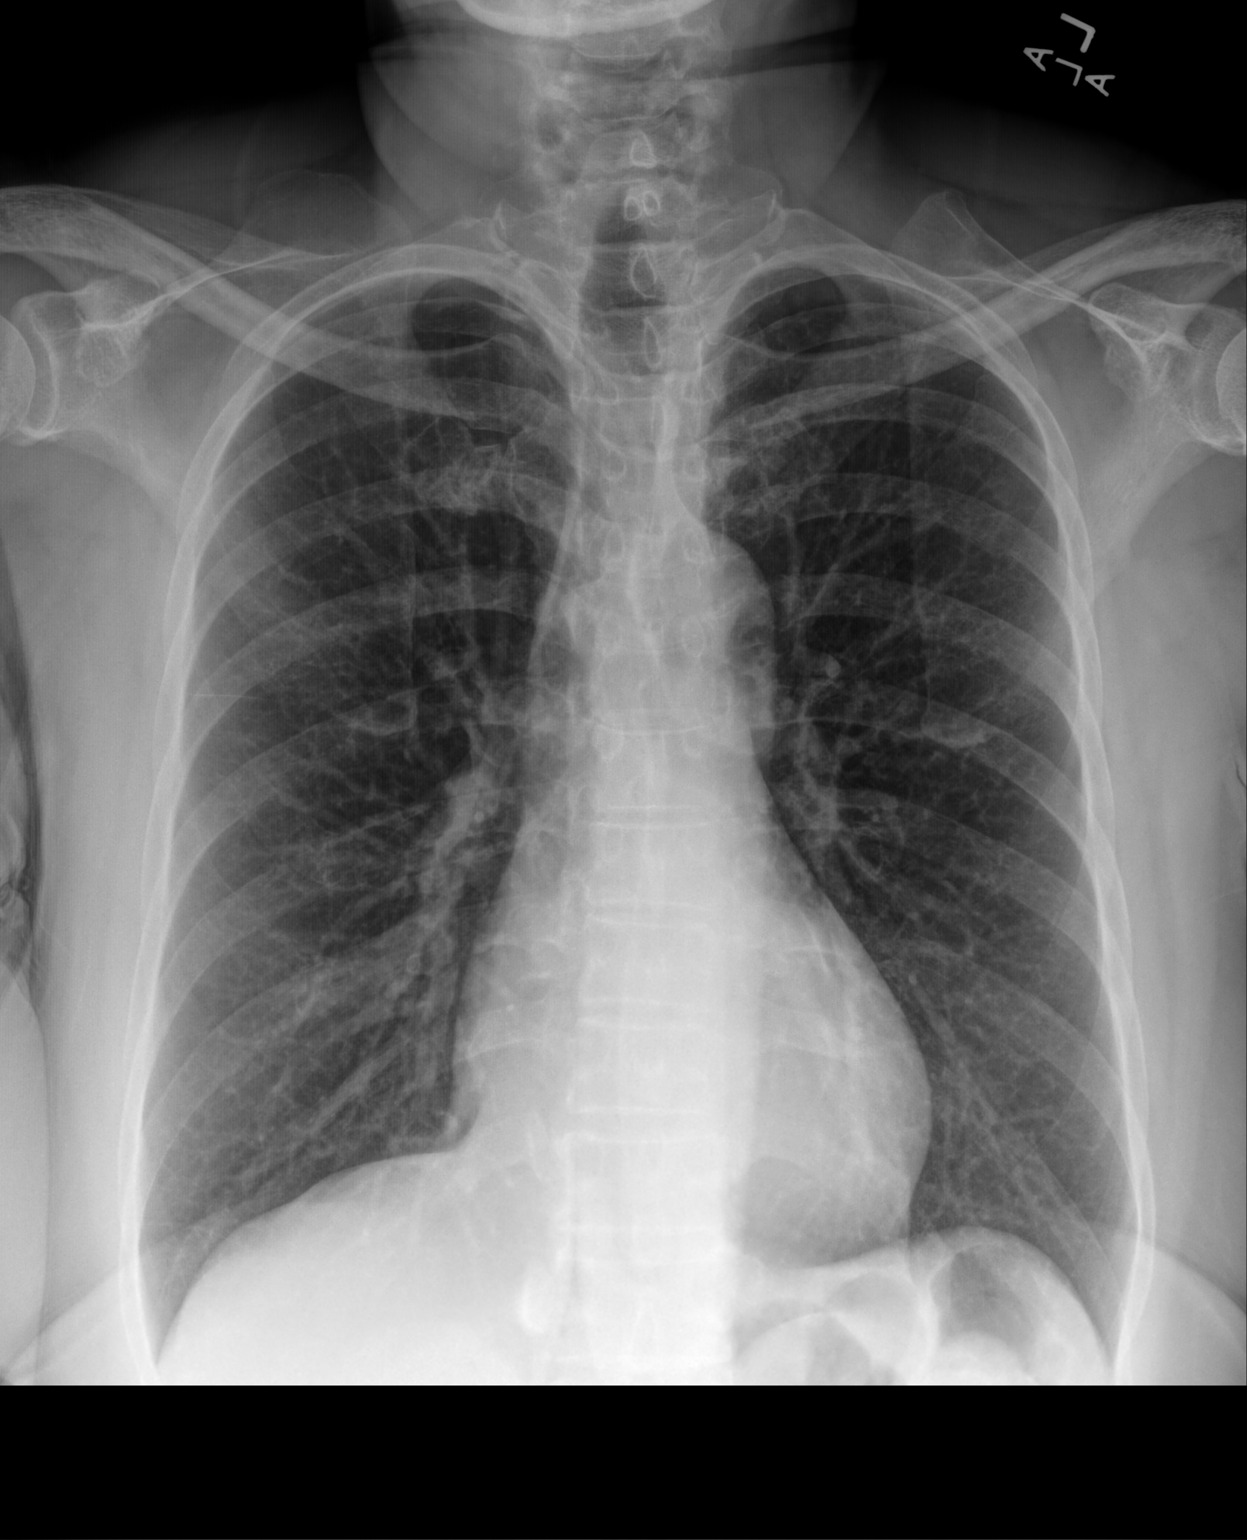

[chest lat]
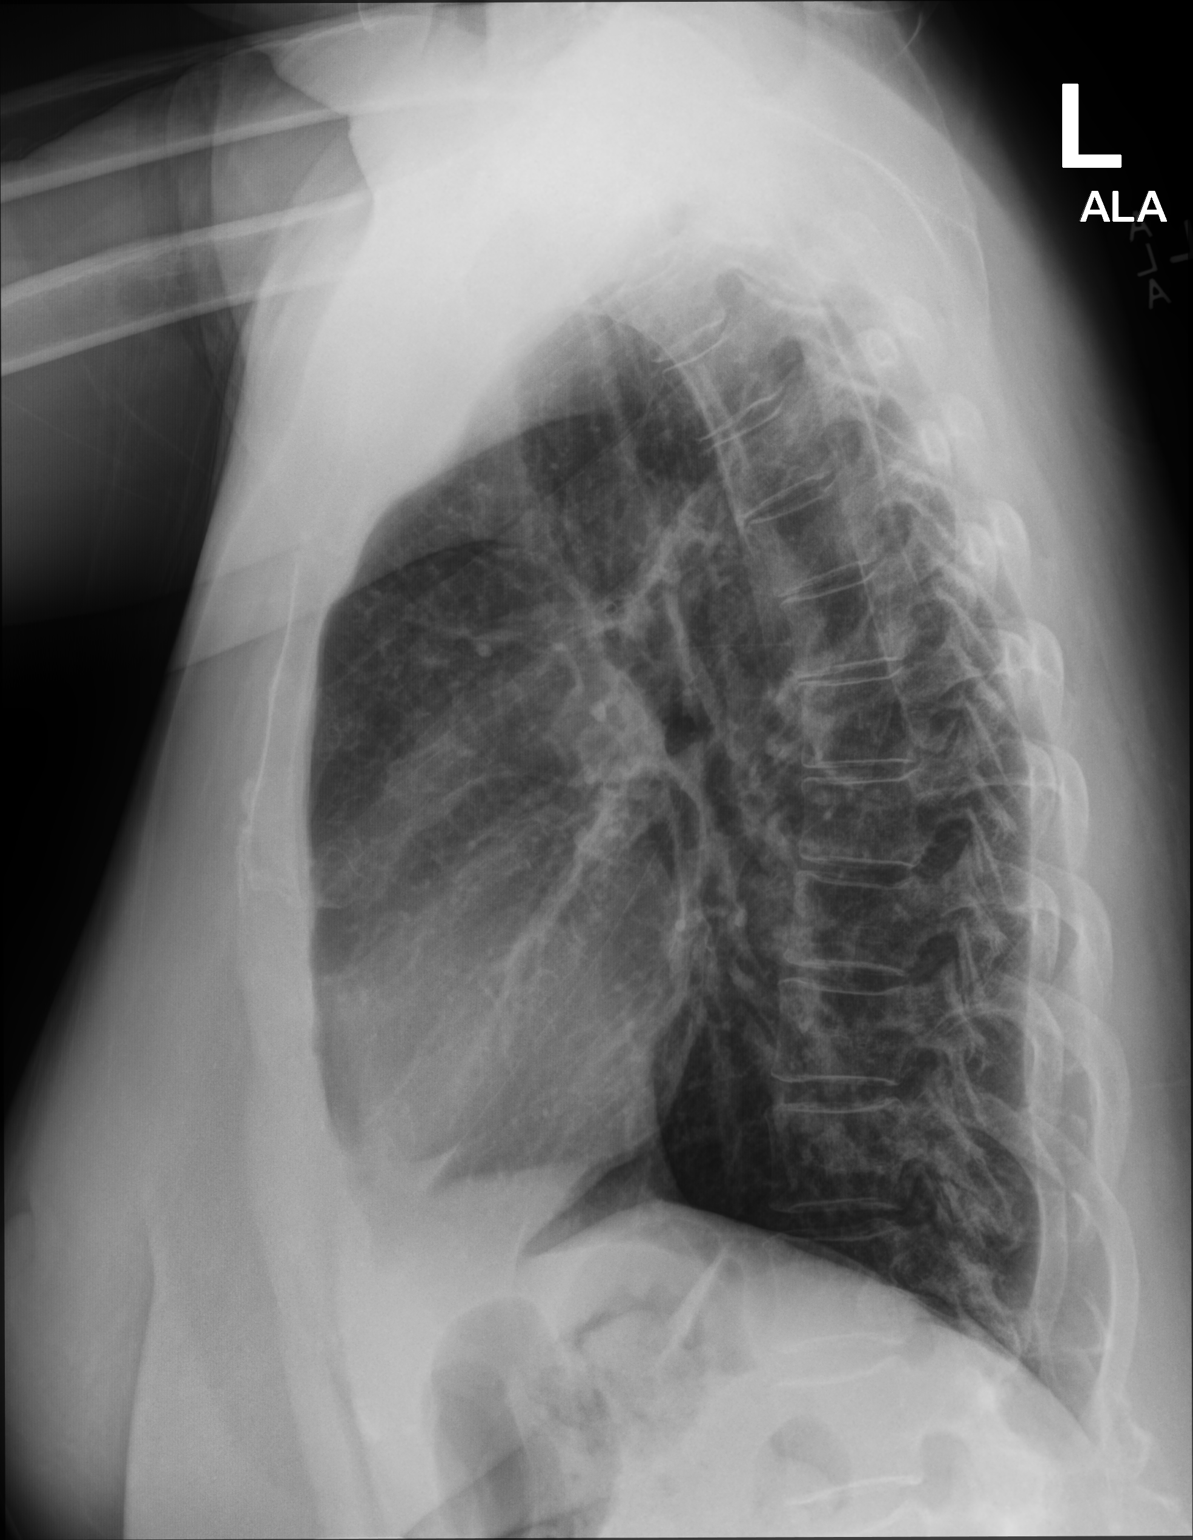

[2 of 2 positions shown; findings below may reference images not displayed]

FINDINGS: The heart size and mediastinal contours are within normal limits.
Both lungs are clear. The visualized skeletal structures are
unremarkable.
IMPRESSION: No active cardiopulmonary disease.

## 2022-08-06 ENCOUNTER — Ambulatory Visit: Payer: 59

## 2022-08-08 ENCOUNTER — Ambulatory Visit
Admission: RE | Admit: 2022-08-08 | Discharge: 2022-08-08 | Disposition: A | Discharge status: Home / Self Care | Payer: 59 | Source: Ambulatory Visit | Attending: Family Medicine | Admitting: Family Medicine

## 2022-08-08 DIAGNOSIS — Z1231 Encounter for screening mammogram for malignant neoplasm of breast: Secondary | ICD-10-CM

## 2022-08-18 ENCOUNTER — Other Ambulatory Visit: Payer: Self-pay | Admitting: Family Medicine

## 2022-08-18 DIAGNOSIS — I10 Essential (primary) hypertension: Secondary | ICD-10-CM

## 2022-08-20 NOTE — Telephone Encounter (Signed)
This has been sent in  

## 2022-08-20 NOTE — Telephone Encounter (Signed)
Pt called to request / FU on a refill of the following:  amLODipine (NORVASC) 10 MG tablet   LOV:  04/02/22  Please advise.  [facility] [place], [place] AT [address] [place] Phone: [phone]  Fax: [phone]

## 2023-02-06 NOTE — Progress Notes (Deleted)
   ACUTE VISIT No chief complaint on file.  HPI: [name] is a 62 y.o. female, who is here today complaining of *** HPI  Review of Systems See other pertinent positives and negatives in HPI.  Current Outpatient Medications on File Prior to Visit  Medication Sig Dispense Refill   amLODipine (NORVASC) 10 MG tablet TAKE 1 TABLET(10 MG) BY MOUTH DAILY 90 tablet 1   CALCIUM PO Take 600 mg by mouth 2 (two) times daily.     Cholecalciferol (VITAMIN D3 PO) Take 1 tablet by mouth daily at 6 (six) AM.     fluticasone (FLONASE) 50 MCG/ACT nasal spray SHAKE LIQUID AND USE 1 SPRAY IN EACH NOSTRIL TWICE DAILY (Patient taking differently: Place 1 spray into both nostrils daily as needed for allergies.) 16 g 3   rosuvastatin (CRESTOR) 10 MG tablet Take 1 tablet (10 mg total) by mouth daily. 90 tablet 3   No current facility-administered medications on file prior to visit.    Past Medical History:  Diagnosis Date   Chicken pox    Hyperlipidemia    on meds   Hypertension    on meds   No Known Allergies  Social History   Socioeconomic History   Marital status: Widowed    Spouse name: Not on file   Number of children: 1   Years of education: Not on file   Highest education level: Not on file  Occupational History   Not on file  Tobacco Use   Smoking status: Never   Smokeless tobacco: Never  Vaping Use   Vaping status: Never Used  Substance and Sexual Activity   Alcohol use: No   Drug use: No   Sexual activity: Yes  Other Topics Concern   Not on file  Social History Narrative   One daughter    Left handed    Lives in a one story home    Social Determinants of Health   Financial Resource Strain: Not on file  Food Insecurity: Not on file  Transportation Needs: Not on file  Physical Activity: Not on file  Stress: Not on file  Social Connections: Not on file    There were no vitals filed for this visit. There is no height or weight on file to calculate  BMI.  Physical Exam  ASSESSMENT AND PLAN: There are no diagnoses linked to this encounter.  No follow-ups on file.  Betty G. [place], MD  [facility]. [place] office.  Discharge Instructions   None

## 2023-02-08 ENCOUNTER — Ambulatory Visit: Payer: 59 | Admitting: Family Medicine

## 2023-02-11 ENCOUNTER — Other Ambulatory Visit: Payer: Self-pay | Admitting: Family Medicine

## 2023-02-11 DIAGNOSIS — I10 Essential (primary) hypertension: Secondary | ICD-10-CM

## 2023-02-15 NOTE — Progress Notes (Unsigned)
   ACUTE VISIT No chief complaint on file.  HPI: [name] is a 62 y.o. female, who is here today complaining of *** HPI  Review of Systems See other pertinent positives and negatives in HPI.  Current Outpatient Medications on File Prior to Visit  Medication Sig Dispense Refill  . amLODipine (NORVASC) 10 MG tablet TAKE 1 TABLET(10 MG) BY MOUTH DAILY 90 tablet 1  . CALCIUM PO Take 600 mg by mouth 2 (two) times daily.    . Cholecalciferol (VITAMIN D3 PO) Take 1 tablet by mouth daily at 6 (six) AM.    . fluticasone (FLONASE) 50 MCG/ACT nasal spray SHAKE LIQUID AND USE 1 SPRAY IN EACH NOSTRIL TWICE DAILY (Patient taking differently: Place 1 spray into both nostrils daily as needed for allergies.) 16 g 3  . rosuvastatin (CRESTOR) 10 MG tablet Take 1 tablet (10 mg total) by mouth daily. 90 tablet 3   No current facility-administered medications on file prior to visit.    Past Medical History:  Diagnosis Date  . Chicken pox   . Hyperlipidemia    on meds  . Hypertension    on meds   No Known Allergies  Social History   Socioeconomic History  . Marital status: Widowed    Spouse name: Not on file  . Number of children: 1  . Years of education: Not on file  . Highest education level: Not on file  Occupational History  . Not on file  Tobacco Use  . Smoking status: Never  . Smokeless tobacco: Never  Vaping Use  . Vaping status: Never Used  Substance and Sexual Activity  . Alcohol use: No  . Drug use: No  . Sexual activity: Yes  Other Topics Concern  . Not on file  Social History Narrative   One daughter    Left handed    Lives in a one story home    Social Determinants of Health   Financial Resource Strain: Not on file  Food Insecurity: Not on file  Transportation Needs: Not on file  Physical Activity: Not on file  Stress: Not on file  Social Connections: Not on file    There were no vitals filed for this visit. There is no height or weight on  file to calculate BMI.  Physical Exam  ASSESSMENT AND PLAN: There are no diagnoses linked to this encounter.  No follow-ups on file.  Kelcie Currie G. [place], MD  [facility]. [place] office.  Discharge Instructions   None

## 2023-02-19 ENCOUNTER — Ambulatory Visit: Payer: 59 | Admitting: Family Medicine

## 2023-02-19 ENCOUNTER — Encounter: Payer: Self-pay | Admitting: Family Medicine

## 2023-02-19 VITALS — BP 128/70 | HR 90 | Temp 98.0°F | Resp 16 | Ht 66.0 in | Wt 154.4 lb

## 2023-02-19 DIAGNOSIS — R102 Pelvic and perineal pain: Secondary | ICD-10-CM | POA: Diagnosis not present

## 2023-02-19 DIAGNOSIS — R3915 Urgency of urination: Secondary | ICD-10-CM

## 2023-02-19 LAB — URINALYSIS, ROUTINE W REFLEX MICROSCOPIC
Bilirubin Urine: NEGATIVE
Hgb urine dipstick: NEGATIVE
Ketones, ur: NEGATIVE
Leukocytes,Ua: NEGATIVE
Nitrite: NEGATIVE
RBC / HPF: NONE SEEN (ref 0–?)
Specific Gravity, Urine: 1.015 (ref 1.000–1.030)
Total Protein, Urine: NEGATIVE
Urine Glucose: NEGATIVE
Urobilinogen, UA: 0.2 (ref 0.0–1.0)
WBC, UA: NONE SEEN (ref 0–?)
pH: 6 (ref 5.0–8.0)

## 2023-02-19 NOTE — Patient Instructions (Signed)
A few things to remember from today's visit:  Urinary urgency - Plan: Urinalysis, Routine w reflex microscopic  Suprapubic abdominal pain - Plan: Urinalysis, Routine w reflex microscopic, US PELVIC COMPLETE WITH TRANSVAGINAL I think "knot" you are reporting is part of scaring changes and more noticeable after wt loss. Kegel exercises may help with urinary symptoms. Monitor for new symptoms.  Do not use My Chart to request refills or for acute issues that need immediate attention. If you send a my chart message, it may take a few days to be addressed, specially if I am not in the office.  Please be sure medication list is accurate. If a new problem present, please set up appointment sooner than planned today.

## 2023-02-25 ENCOUNTER — Inpatient Hospital Stay: Admission: RE | Admit: 2023-02-25 | Payer: 59 | Source: Ambulatory Visit

## 2023-02-26 ENCOUNTER — Ambulatory Visit
Admission: RE | Admit: 2023-02-26 | Discharge: 2023-02-26 | Disposition: A | Discharge status: Home / Self Care | Payer: 59 | Source: Ambulatory Visit | Attending: Family Medicine | Admitting: Family Medicine

## 2023-02-26 DIAGNOSIS — R102 Pelvic and perineal pain: Secondary | ICD-10-CM

## 2023-03-11 ENCOUNTER — Other Ambulatory Visit: Payer: Self-pay

## 2023-03-11 DIAGNOSIS — D219 Benign neoplasm of connective and other soft tissue, unspecified: Secondary | ICD-10-CM

## 2023-04-05 ENCOUNTER — Encounter: Payer: 59 | Admitting: Family Medicine

## 2023-04-16 ENCOUNTER — Encounter: Payer: 59 | Admitting: Family Medicine

## 2023-04-30 ENCOUNTER — Ambulatory Visit (INDEPENDENT_AMBULATORY_CARE_PROVIDER_SITE_OTHER): Payer: 59 | Admitting: Nurse Practitioner

## 2023-04-30 ENCOUNTER — Encounter: Payer: Self-pay | Admitting: Nurse Practitioner

## 2023-04-30 VITALS — BP 132/80 | HR 77 | Ht 66.0 in | Wt 160.0 lb

## 2023-04-30 DIAGNOSIS — D219 Benign neoplasm of connective and other soft tissue, unspecified: Secondary | ICD-10-CM | POA: Diagnosis not present

## 2023-04-30 DIAGNOSIS — N3941 Urge incontinence: Secondary | ICD-10-CM | POA: Diagnosis not present

## 2023-04-30 NOTE — Progress Notes (Signed)
   Acute Office Visit  Subjective:    Patient ID: Rachel Love, female    DOB: 03/23/1961, 62 y.o.   MRN: [id number]   HPI 62 y.o. G1P0010 presents as new patient for uterine fibroids. Saw PCP 02/19/23 with complaints of vaginal pressure. Felt like there was a tampon in her vagina and felt things were "breezy". Denies pain. Symptoms have resolved. She did recently start shaving again after many years and lost 50 pounds. Wondering if these could have made her feel different. U/S 02/26/23 showed 3 fibroids -  anterior subserosal 1.8 x 1.7 x 1.1 cm, posterior lower uterine segment - 2.5 x 2.2 x 1.6 cm, and left intramural - 2.2 x 2.0 x 1.9 cm. S/P 2003 myomectomy. Menopausal since 2014. Denies bleeding. Not sexually active. Complains of some urge incontinence.   Patient's last menstrual period was 02/22/2013.    Review of Systems  Constitutional: Negative.   Gastrointestinal:  Negative for abdominal pain.  Genitourinary:  Positive for urgency. Negative for difficulty urinating, dysuria, flank pain, frequency, hematuria, pelvic pain, vaginal bleeding, vaginal discharge and vaginal pain.       Objective:    Physical Exam Constitutional:      Appearance: Normal appearance.  Genitourinary:    General: Normal vulva.     Vagina: Normal.     Cervix: Normal.     Uterus: Normal.      Adnexa: Right adnexa normal and left adnexa normal.     Comments: No evidence of prolapse    BP 132/80   Pulse 77   Ht 5\' 6"  (1.676 m) Comment: pt reported  Wt 160 lb (72.6 kg)   LMP 02/22/2013   SpO2 98%   BMI 25.82 kg/m  Wt Readings from Last 3 Encounters:  04/30/23 160 lb (72.6 kg)  02/19/23 154 lb 6 oz (70 kg)  04/02/22 183 lb 4 oz (83.1 kg)        Patient informed chaperone available to be present for breast and/or pelvic exam. Patient has requested no chaperone to be present. Patient has been advised what will be completed during breast and pelvic exam.   Assessment & Plan:    Problem List Items Addressed This Visit   None Visit Diagnoses     Fibroid    -  Primary   Urge incontinence          Plan: Discussed nature of uterine fibroids and menopause. Likely not cause for symptoms she was experiencing and have probably decreased in size. No evidence of pelvic organ prolapse. Discussed how significant weight loss can affect female anatomy. Educated on causes of urge incontinence and management options. Will try some lifestyle changes and pelvic floor strengthening exercises at home. If no improvement will consider pelvic floor PT.  Return if symptoms worsen or fail to improve.    Olivia Mackie DNP, 11:56 AM 04/30/2023

## 2023-05-28 ENCOUNTER — Telehealth: Payer: Self-pay | Admitting: Family Medicine

## 2023-05-28 DIAGNOSIS — I10 Essential (primary) hypertension: Secondary | ICD-10-CM

## 2023-05-28 MED ORDER — AMLODIPINE BESYLATE 10 MG PO TABS: 10.0000 mg | ORAL_TABLET | Freq: Every day | ORAL | 0 refills | Status: DC

## 2023-05-28 NOTE — Telephone Encounter (Signed)
Prescription Request  05/28/2023  LOV: 02/19/2023  What is the name of the medication or equipment? amLODipine (NORVASC) 10 MG tablet . Pt has an appt on 06-05-2023  Have you contacted your pharmacy to request a refill? No   Which pharmacy would you like this sent to?  [id number] [place], [place] - [address] AT [facility] OF [address] & [facility] [address] [place] [address] Phone: [phone] Fax: [phone]    Patient notified that their request is being sent to the clinical staff for review and that they should receive a response within 2 business days.   Please advise at Mobile [phone] (mobile)

## 2023-05-28 NOTE — Telephone Encounter (Signed)
Rx sent 
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
## 2023-06-05 ENCOUNTER — Encounter: Payer: Self-pay | Admitting: Family Medicine

## 2023-06-05 ENCOUNTER — Ambulatory Visit (INDEPENDENT_AMBULATORY_CARE_PROVIDER_SITE_OTHER): Payer: 59 | Admitting: Family Medicine

## 2023-06-05 VITALS — BP 120/78 | HR 97 | Temp 98.7°F | Resp 16 | Ht 66.0 in | Wt 160.4 lb

## 2023-06-05 DIAGNOSIS — R7303 Prediabetes: Secondary | ICD-10-CM | POA: Diagnosis not present

## 2023-06-05 DIAGNOSIS — I10 Essential (primary) hypertension: Secondary | ICD-10-CM

## 2023-06-05 DIAGNOSIS — Z23 Encounter for immunization: Secondary | ICD-10-CM

## 2023-06-05 DIAGNOSIS — Z0000 Encounter for general adult medical examination without abnormal findings: Secondary | ICD-10-CM | POA: Diagnosis not present

## 2023-06-05 DIAGNOSIS — E785 Hyperlipidemia, unspecified: Secondary | ICD-10-CM | POA: Diagnosis not present

## 2023-06-05 NOTE — Assessment & Plan Note (Addendum)
Non pharmacologic treatment to continue. Further recommendations will be given according to 10 years CVD risk score and lipid panel numbers. She would like to have blood work in 2 months given the fact she just came back from vacation and did not follow dietary recommendations for a week.

## 2023-06-05 NOTE — Assessment & Plan Note (Signed)
Last HgA1C was 5.9 in 04/2022. Encouraged to continue healthy lifestyle for diabetes prevention. HgA1C will be added to next blood work.

## 2023-06-05 NOTE — Assessment & Plan Note (Signed)
We discussed the importance of regular physical activity and healthy diet for prevention of chronic illness and/or complications. Preventive guidelines reviewed. Vaccination up to date. Ca++ and vit D supplementation to continue. Due for pap smear next year. Next CPE in a year.

## 2023-06-05 NOTE — Patient Instructions (Addendum)
A few things to remember from today's visit:  Routine general medical examination at a health care facility  Hypertension, essential, benign  Hyperlipidemia, unspecified hyperlipidemia type  Prediabetes  Need for influenza vaccination - Plan: Flu vaccine trivalent PF, 6mos and older(Flulaval,Afluria,Fluarix,Fluzone)  Try decreasing Amlodipine from 10 mg to 5 mg for 4 weeks instead taking med every other day. If blood pressure 120/70's you can continue 5 mg and let me know, so I can change prescription.  If you need refills for medications you take chronically, please call your pharmacy. Do not use My Chart to request refills or for acute issues that need immediate attention. If you send a my chart message, it may take a few days to be addressed, specially if I am not in the office.  Please be sure medication list is accurate. If a new problem present, please set up appointment sooner than planned today.  Health Maintenance, Female Adopting a healthy lifestyle and getting preventive care are important in promoting health and wellness. Ask your health care provider about: The right schedule for you to have regular tests and exams. Things you can do on your own to prevent diseases and keep yourself healthy. What should I know about diet, weight, and exercise? Eat a healthy diet  Eat a diet that includes plenty of vegetables, fruits, low-fat dairy products, and lean protein. Do not eat a lot of foods that are high in solid fats, added sugars, or sodium. Maintain a healthy weight Body mass index (BMI) is used to identify weight problems. It estimates body fat based on height and weight. Your health care provider can help determine your BMI and help you achieve or maintain a healthy weight. Get regular exercise Get regular exercise. This is one of the most important things you can do for your health. Most adults should: Exercise for at least 150 minutes each week. The exercise should  increase your heart rate and make you sweat (moderate-intensity exercise). Do strengthening exercises at least twice a week. This is in addition to the moderate-intensity exercise. Spend less time sitting. Even light physical activity can be beneficial. Watch cholesterol and blood lipids Have your blood tested for lipids and cholesterol at 62 years of age, then have this test every 5 years. Have your cholesterol levels checked more often if: Your lipid or cholesterol levels are high. You are older than 62 years of age. You are at high risk for heart disease. What should I know about cancer screening? Depending on your health history and family history, you may need to have cancer screening at various ages. This may include screening for: Breast cancer. Cervical cancer. Colorectal cancer. Skin cancer. Lung cancer. What should I know about heart disease, diabetes, and high blood pressure? Blood pressure and heart disease High blood pressure causes heart disease and increases the risk of stroke. This is more likely to develop in people who have high blood pressure readings or are overweight. Have your blood pressure checked: Every 3-5 years if you are 75-58 years of age. Every year if you are 24 years old or older. Diabetes Have regular diabetes screenings. This checks your fasting blood sugar level. Have the screening done: Once every three years after age 73 if you are at a normal weight and have a low risk for diabetes. More often and at a younger age if you are overweight or have a high risk for diabetes. What should I know about preventing infection? Hepatitis B If you have a higher  risk for hepatitis B, you should be screened for this virus. Talk with your health care provider to find out if you are at risk for hepatitis B infection. Hepatitis C Testing is recommended for: Everyone born from [birth year] through 1965. Anyone with known risk factors for hepatitis C. Sexually transmitted  infections (STIs) Get screened for STIs, including gonorrhea and chlamydia, if: You are sexually active and are younger than 62 years of age. You are older than 62 years of age and your health care provider tells you that you are at risk for this type of infection. Your sexual activity has changed since you were last screened, and you are at increased risk for chlamydia or gonorrhea. Ask your health care provider if you are at risk. Ask your health care provider about whether you are at high risk for HIV. Your health care provider may recommend a prescription medicine to help prevent HIV infection. If you choose to take medicine to prevent HIV, you should first get tested for HIV. You should then be tested every 3 months for as long as you are taking the medicine. Pregnancy If you are about to stop having your period (premenopausal) and you may become pregnant, seek counseling before you get pregnant. Take 400 to 800 micrograms (mcg) of folic acid every day if you become pregnant. Ask for birth control (contraception) if you want to prevent pregnancy. Osteoporosis and menopause Osteoporosis is a disease in which the bones lose minerals and strength with aging. This can result in bone fractures. If you are 38 years old or older, or if you are at risk for osteoporosis and fractures, ask your health care provider if you should: Be screened for bone loss. Take a calcium or vitamin D supplement to lower your risk of fractures. Be given hormone replacement therapy (HRT) to treat symptoms of menopause. Follow these instructions at home: Alcohol use Do not drink alcohol if: Your health care provider tells you not to drink. You are pregnant, may be pregnant, or are planning to become pregnant. If you drink alcohol: Limit how much you have to: 0-1 drink a day. Know how much alcohol is in your drink. In the [place] one drink equals one 12 oz bottle of beer (355 mL), one 5 oz glass of wine (148 mL), or one  1 oz glass of hard liquor (44 mL). Lifestyle Do not use any products that contain nicotine or tobacco. These products include cigarettes, chewing tobacco, and vaping devices, such as e-cigarettes. If you need help quitting, ask your health care provider. Do not use street drugs. Do not share needles. Ask your health care provider for help if you need support or information about quitting drugs. General instructions Schedule regular health, dental, and eye exams. Stay current with your vaccines. Tell your health care provider if: You often feel depressed. You have ever been abused or do not feel safe at home. Summary Adopting a healthy lifestyle and getting preventive care are important in promoting health and wellness. Follow your health care provider's instructions about healthy diet, exercising, and getting tested or screened for diseases. Follow your health care provider's instructions on monitoring your cholesterol and blood pressure. This information is not intended to replace advice given to you by your health care provider. Make sure you discuss any questions you have with your health care provider. Document Revised: 11/07/2020 Document Reviewed: 11/07/2020 Elsevier Patient Education  2024 [employer].

## 2023-06-05 NOTE — Assessment & Plan Note (Signed)
BP adequately controlled.  Recommend decreasing dose of amlodipine from 10 mg to 5 mg daily instead taking medication every other day, instructed to monitor BP daily for the next 4 weeks and to let me know BP readings. Continue low-salt diet. Eye exam is current. As far as BP is stable, annual follow-up is appropriate.

## 2023-06-24 ENCOUNTER — Telehealth: Payer: Self-pay

## 2023-06-24 ENCOUNTER — Ambulatory Visit (INDEPENDENT_AMBULATORY_CARE_PROVIDER_SITE_OTHER): Payer: 59 | Admitting: Family Medicine

## 2023-06-24 VITALS — BP 130/80 | HR 105 | Temp 98.4°F | Resp 16 | Ht 66.0 in | Wt 161.5 lb

## 2023-06-24 DIAGNOSIS — R062 Wheezing: Secondary | ICD-10-CM

## 2023-06-24 DIAGNOSIS — J988 Other specified respiratory disorders: Secondary | ICD-10-CM

## 2023-06-24 MED ORDER — PREDNISONE 20 MG PO TABS: 40.0000 mg | ORAL_TABLET | Freq: Every day | ORAL | 0 refills | Status: AC

## 2023-06-24 MED ORDER — DOXYCYCLINE HYCLATE 100 MG PO TABS: 100.0000 mg | ORAL_TABLET | Freq: Two times a day (BID) | ORAL | 0 refills | Status: AC

## 2023-06-24 MED ORDER — ALBUTEROL SULFATE HFA 108 (90 BASE) MCG/ACT IN AERS: 2.0000 | INHALATION_SPRAY | Freq: Four times a day (QID) | RESPIRATORY_TRACT | 0 refills | Status: DC | PRN

## 2023-06-24 NOTE — Telephone Encounter (Signed)
I called and spoke with patient. She did a tele health visit with her insurance last week. They advised if she wasn't better by Monday to call PCP's office. Pt has been having a cough for over a week, coughing up yellow/green/brown mucus. Has been using Singular, mucinex and coricidin with no relief. Appointment made for 1:15 today so PCP can listen to pt's lungs.
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
## 2023-06-24 NOTE — Patient Instructions (Addendum)
A few things to remember from today's visit:  Wheezing - Plan: predniSONE (DELTASONE) 20 MG tablet, albuterol (VENTOLIN HFA) 108 (90 Base) MCG/ACT inhaler  Respiratory tract infection - Plan: doxycycline (VIBRA-TABS) 100 MG tablet I do not think you need antibiotics at this time but because clinic access during the holidays, I sent a prescription in case you needed. If any fever or cough gets worse you can started as far as you are not in respiratory distress, in which case you need to seek immediate medical attention. If wheezing persist Prednisone with breakfast for 3 days. Plain mucinex and adequate hydration. Voice rest. Albuterol inh 2 puff every 6 hours for a week then as needed for wheezing or shortness of breath.  ,  If you need refills for medications you take chronically, please call your pharmacy. Do not use My Chart to request refills or for acute issues that need immediate attention. If you send a my chart message, it may take a few days to be addressed, specially if I am not in the office.  Please be sure medication list is accurate. If a new problem present, please set up appointment sooner than planned today.

## 2023-06-24 NOTE — Telephone Encounter (Signed)
Copied from CRM [id number]. Topic: Clinical - Medical Advice >> Jun 24, 2023  9:10 AM Florestine Avers wrote: Reason for CRM: Pt had tele visit Friday, and was prescribed something for her cough. Pt states she is still feeling sick and was told if she does not feel better over the weekend to call back. She is requesting a different rx for her cough as well as a call from the nurse.

## 2023-06-27 ENCOUNTER — Other Ambulatory Visit: Payer: Self-pay | Admitting: Family Medicine

## 2023-06-27 DIAGNOSIS — Z1231 Encounter for screening mammogram for malignant neoplasm of breast: Secondary | ICD-10-CM

## 2023-07-22 ENCOUNTER — Other Ambulatory Visit: Payer: Self-pay | Admitting: Family Medicine

## 2023-07-22 DIAGNOSIS — R062 Wheezing: Secondary | ICD-10-CM

## 2023-08-12 ENCOUNTER — Ambulatory Visit: Payer: 59

## 2023-08-12 ENCOUNTER — Ambulatory Visit
Admission: RE | Admit: 2023-08-12 | Discharge: 2023-08-12 | Disposition: A | Discharge status: Home / Self Care | Payer: 59 | Source: Ambulatory Visit | Attending: Family Medicine | Admitting: Family Medicine

## 2023-08-12 DIAGNOSIS — Z1231 Encounter for screening mammogram for malignant neoplasm of breast: Secondary | ICD-10-CM

## 2023-09-18 ENCOUNTER — Other Ambulatory Visit: Payer: Self-pay | Admitting: Family Medicine

## 2023-09-18 DIAGNOSIS — J309 Allergic rhinitis, unspecified: Secondary | ICD-10-CM

## 2023-11-28 ENCOUNTER — Ambulatory Visit: Admitting: Podiatry

## 2023-11-28 ENCOUNTER — Ambulatory Visit

## 2023-11-28 ENCOUNTER — Ambulatory Visit (INDEPENDENT_AMBULATORY_CARE_PROVIDER_SITE_OTHER)

## 2023-11-28 DIAGNOSIS — M2042 Other hammer toe(s) (acquired), left foot: Secondary | ICD-10-CM | POA: Diagnosis not present

## 2023-11-28 DIAGNOSIS — Q828 Other specified congenital malformations of skin: Secondary | ICD-10-CM

## 2023-11-28 DIAGNOSIS — M7732 Calcaneal spur, left foot: Secondary | ICD-10-CM | POA: Diagnosis not present

## 2023-11-28 DIAGNOSIS — M7731 Calcaneal spur, right foot: Secondary | ICD-10-CM

## 2023-11-28 NOTE — Patient Instructions (Signed)
 Keep the bandage on for 24 hours. At that time, remove and clean with soap and water. If it hurts or burns before 24 hours go ahead and remove the bandage and wash with soap and water. Keep the area clean. If there is any blistering cover with antibiotic ointment and a bandage. Monitor for any redness, drainage, or other signs of infection. Call the office if any are to occur. If you have any questions, please call the office at [phone].

## 2023-11-28 NOTE — Progress Notes (Signed)
  Subjective:  Patient ID: Rachel Love, female    DOB: 20-May-1961,  MRN: [id number]  Chief Complaint  Patient presents with   Callouses    Patient is here for callus on left heel. Patient states there no pain, callus has been there for the last 6 months.    Discussed the use of AI scribe software for clinical note transcription with the patient, who gave verbal consent to proceed.  History of Present Illness Rachel Love is a 63 year old female who presents with a callus on her heel causing discomfort.  The callus on her heel has been present for approximately four months, causing discomfort, especially when wearing shoes without adequate cushioning. There is no bleeding or drainage, and significant pain occurs only with pressure.  She denies any injuries or stepping any foreign objects.  No swelling, redness or any drainage.  She has undergone surgical intervention for hammertoes. One toe has a recurring corn, causing discomfort in regular shoes.      Objective:    Physical Exam General: AAO x3, NAD  Dermatological: Aspect of the left heel there is a annular hyperkeratotic lesion.  Upon debridement there is no underlying ulceration, drainage or signs of action.  No foreign body or puncture wound noted.  There is hyperkeratotic tissue in the left fifth toe.  Vascular: Dorsalis Pedis artery and Posterior Tibial artery pedal pulses are 2/4 bilateral with immedate capillary fill time. There is no pain with calf compression, swelling, warmth, erythema.   Neruologic: Grossly intact via light touch bilateral.   Musculoskeletal: Tenderness palpation of the skin lesion to the left plantar heel. Adductovarus hammertoes noted.   No images are attached to the encounter.    Results Procedure: Callus Trimming Description: Trimming of hyperkeratotic tissue on the heel. A core in the middle was debrided, leaving a small cavity where the callus was  removed.  RADIOLOGY Foot X-ray: Under the area of concern there is no evidence of foreign body.  There is minimal calcaneal spurring noted inferiorly.  Larger calcaneal spur posteriorly.  On the lateral view there is calcification, shadow of the fifth metatarsal base area but this is not where she is having her symptoms.    Assessment:   Heel spur, left Porokeratosis/skin lesion Hammertoe left    Plan:  Patient was evaluated and treated and all questions answered.  Assessment and Plan Assessment & Plan Callus on heel X-ray shows small heel spur, not problematic. Discussed alternative treatments if recurrence occurs. Informed about potential side effects of selenocaine pad. - Sharply debrided the hyperkeratotic lesion with any complications or bleeding.  Cleaned the skin with alcohol.  Salinocaine was applied followed by dressing.  Postprocedure instructions discussed. - Remove if pain or burning occurs. - Wash area with soap and water after pad removal. - Monitor for infection signs: redness, swelling, drainage. - Keep area moisturized. - File area gently if needed. - Contact if callus persists or worsens in 4-6 weeks.  Residual hammertoe left fifth toe - As well as surgical intervention in the future.  Will monitor.  We briefly discussed revisional fifth digit arthroplasty.  No follow-ups on file.

## 2024-04-09 ENCOUNTER — Other Ambulatory Visit: Payer: Self-pay | Admitting: Family Medicine

## 2024-04-09 DIAGNOSIS — I10 Essential (primary) hypertension: Secondary | ICD-10-CM

## 2024-07-08 ENCOUNTER — Other Ambulatory Visit: Payer: Self-pay | Admitting: Family Medicine

## 2024-07-08 DIAGNOSIS — I10 Essential (primary) hypertension: Secondary | ICD-10-CM

## 2024-07-21 ENCOUNTER — Other Ambulatory Visit: Payer: Self-pay | Admitting: Family Medicine

## 2024-07-21 DIAGNOSIS — Z1231 Encounter for screening mammogram for malignant neoplasm of breast: Secondary | ICD-10-CM

## 2024-08-13 ENCOUNTER — Ambulatory Visit
